# Patient Record
Sex: Female | Born: 1960 | Race: White | Hispanic: No | Marital: Single | Smoking: Never smoker
Health system: Southern US, Community
[De-identification: ages and names within clinical notes are randomized; demographics above are authoritative.]

## PROBLEM LIST (undated history)

## (undated) DIAGNOSIS — F419 Anxiety disorder, unspecified: Secondary | ICD-10-CM

## (undated) DIAGNOSIS — D649 Anemia, unspecified: Secondary | ICD-10-CM

## (undated) DIAGNOSIS — E785 Hyperlipidemia, unspecified: Secondary | ICD-10-CM

## (undated) DIAGNOSIS — R011 Cardiac murmur, unspecified: Secondary | ICD-10-CM

## (undated) DIAGNOSIS — F329 Major depressive disorder, single episode, unspecified: Secondary | ICD-10-CM

## (undated) DIAGNOSIS — F32A Depression, unspecified: Secondary | ICD-10-CM

## (undated) DIAGNOSIS — Z8619 Personal history of other infectious and parasitic diseases: Secondary | ICD-10-CM

## (undated) DIAGNOSIS — J45909 Unspecified asthma, uncomplicated: Secondary | ICD-10-CM

## (undated) DIAGNOSIS — Z5189 Encounter for other specified aftercare: Secondary | ICD-10-CM

## (undated) HISTORY — DX: Cardiac murmur, unspecified: R01.1

## (undated) HISTORY — PX: OTHER SURGICAL HISTORY: SHX169

## (undated) HISTORY — PX: BREAST SURGERY: SHX581

## (undated) HISTORY — DX: Anemia, unspecified: D64.9

## (undated) HISTORY — DX: Personal history of other infectious and parasitic diseases: Z86.19

## (undated) HISTORY — DX: Hyperlipidemia, unspecified: E78.5

## (undated) HISTORY — DX: Major depressive disorder, single episode, unspecified: F32.9

## (undated) HISTORY — DX: Anxiety disorder, unspecified: F41.9

## (undated) HISTORY — PX: TUBAL LIGATION: SHX77

## (undated) HISTORY — DX: Depression, unspecified: F32.A

## (undated) HISTORY — DX: Encounter for other specified aftercare: Z51.89

## (undated) HISTORY — PX: ECTOPIC PREGNANCY SURGERY: SHX613

## (undated) HISTORY — DX: Unspecified asthma, uncomplicated: J45.909

---

## 1999-03-21 ENCOUNTER — Other Ambulatory Visit: Admission: RE | Admit: 1999-03-21 | Discharge: 1999-03-21 | Payer: Self-pay | Admitting: Gynecology

## 2000-03-20 ENCOUNTER — Other Ambulatory Visit: Admission: RE | Admit: 2000-03-20 | Discharge: 2000-03-20 | Payer: Self-pay | Admitting: Gynecology

## 2000-09-25 ENCOUNTER — Other Ambulatory Visit: Admission: RE | Admit: 2000-09-25 | Discharge: 2000-09-25 | Payer: Self-pay | Admitting: Gynecology

## 2000-10-03 ENCOUNTER — Ambulatory Visit (HOSPITAL_COMMUNITY): Admission: RE | Admit: 2000-10-03 | Discharge: 2000-10-03 | Payer: Self-pay | Admitting: Gynecology

## 2000-10-03 ENCOUNTER — Encounter: Payer: Self-pay | Admitting: Gynecology

## 2001-03-18 ENCOUNTER — Other Ambulatory Visit: Admission: RE | Admit: 2001-03-18 | Discharge: 2001-03-18 | Payer: Self-pay | Admitting: Obstetrics and Gynecology

## 2001-07-03 ENCOUNTER — Ambulatory Visit (HOSPITAL_COMMUNITY): Admission: RE | Admit: 2001-07-03 | Discharge: 2001-07-03 | Payer: Self-pay | Admitting: Obstetrics and Gynecology

## 2001-07-03 ENCOUNTER — Encounter: Payer: Self-pay | Admitting: Obstetrics and Gynecology

## 2001-10-10 ENCOUNTER — Inpatient Hospital Stay (HOSPITAL_COMMUNITY): Admission: AD | Admit: 2001-10-10 | Discharge: 2001-10-13 | Payer: Self-pay | Admitting: Obstetrics and Gynecology

## 2001-10-10 ENCOUNTER — Encounter (INDEPENDENT_AMBULATORY_CARE_PROVIDER_SITE_OTHER): Payer: Self-pay

## 2005-05-17 ENCOUNTER — Ambulatory Visit (HOSPITAL_COMMUNITY): Admission: RE | Admit: 2005-05-17 | Discharge: 2005-05-17 | Payer: Self-pay | Admitting: Gynecology

## 2005-09-25 ENCOUNTER — Ambulatory Visit: Payer: Self-pay | Admitting: Internal Medicine

## 2006-04-09 ENCOUNTER — Other Ambulatory Visit: Admission: RE | Admit: 2006-04-09 | Discharge: 2006-04-09 | Payer: Self-pay | Admitting: Gynecology

## 2007-08-18 DIAGNOSIS — J45909 Unspecified asthma, uncomplicated: Secondary | ICD-10-CM | POA: Insufficient documentation

## 2007-08-18 DIAGNOSIS — F329 Major depressive disorder, single episode, unspecified: Secondary | ICD-10-CM

## 2008-12-03 ENCOUNTER — Ambulatory Visit (HOSPITAL_COMMUNITY): Admission: RE | Admit: 2008-12-03 | Discharge: 2008-12-03 | Payer: Self-pay | Admitting: Gynecology

## 2008-12-09 ENCOUNTER — Encounter: Admission: RE | Admit: 2008-12-09 | Discharge: 2008-12-09 | Payer: Self-pay | Admitting: Gynecology

## 2010-12-10 ENCOUNTER — Encounter: Payer: Self-pay | Admitting: Gynecology

## 2011-04-06 NOTE — Discharge Summary (Signed)
[facility]  Patient:    Bethany Sexton, Bethany Sexton Visit Number: [id number] MRN: [id number]          Service Type: OBS Location: 910A 9120 01 Attending Physician:  Miguel Aschoff Dictated by:   Devoria Albe Edward Jolly, M.D. Admit Date:  10/10/2001 Discharge Date: 10/13/2001                             Discharge Summary  ADMISSION DIAGNOSES:          1. Intrauterine gestation at term.                               2. History of prior cesarean section, declines                                  trial of vaginal delivery.                               3. Desire for permanent sterilization.  DISCHARGE DIAGNOSES:          1. Status post repeat low segment transverse                                  cesarean section.                               2. Bilateral tubal ligation.  SIGNIFICANT OPERATIONS AND PROCEDURES:               A repeat low segment transverse cesarean section with bilateral tubal ligation was performed under the direction of Miguel Aschoff, M.D., on October 10, 2001, at the [facility].  HISTORY OF PRESENT ILLNESS:   The patient is a 50 year old, gravida 3, para 0-2-1-2, Caucasian female, status post cesarean section for a twin gestation in 1996, who during her antepartum course elected for a repeat cesarean delivery and permanent sterilization for her current pregnancy.  PHYSICAL EXAMINATION:         The uterus was noted to be gravid with an estimated fetal weight of approximately 7 pounds.  HOSPITAL COURSE:              The patient was admitted on October 10, 2001, at which time she underwent an elective repeat low segment transverse cesarean section with bilateral tubal ligation under the direction of Miguel Aschoff, M.D., at the [facility].  A viable female was delivered with Apgars of 8 at one minute and 9 at five minutes.  The weight was 8 pounds and 2 ounces.  The bilateral tubes and ovaries were noted to be normal at the time of  the surgery, which was uncomplicated.  The patients postoperative course was unremarkable.  The patient was slowly advanced to a regular diet which she was tolerating well at the time of discharge.  The patient had good pain control with Tylox and ibuprofen.  She was able to empty her bladder spontaneously.  The patients incision remained clean, dry, and intact throughout her hospitalization and she was noted to be afebrile.  The patient was ambulating independently at the time of her discharge.  The patients discharge hematocrit  was 25.4% and she was tolerating this well. The final pathology report from the tubal ligation was pending at the time of discharge.  The patient was found to be in good condition and ready for discharge on postoperative day #3.  DISPOSITION:                  The patient is discharged to home.  DIET:                         She will take a regular diet.  DISCHARGE MEDICATIONS:        The patient will take Tylox one to two p.o. q.4-6h. p.r.n.  She will continue with a daily prenatal vitamin.  The patient will take iron sulfate 325 mg p.o. b.i.d.  WOUND CARE:                   The patients staples were removed and Steri-Strips and Benzoin were placed prior to her discharge.  ACTIVITY:                     The patient will have decreased activity over the next six weeks.  SPECIAL INSTRUCTIONS:         She will call if she experiences fever, incisional drainage or redness, increased vaginal bleeding, fever, or any other concern. Dictated by:   Devoria Albe Edward Jolly, M.D. Attending Physician:  Miguel Aschoff DD:  10/13/01 TD:  10/13/01 Job: [id number]

## 2011-04-06 NOTE — Op Note (Signed)
[facility]  Patient:    BRANDA, CHAUDHARY Visit Number: [id number] MRN: [id number]          Service Type: OBS Location: 910A 9120 01 Attending Physician:  Miguel Aschoff Dictated by:   Miguel Aschoff, M.D. Proc. Date: 10/10/01 Admit Date:  10/10/2001                             Operative Report  PREOPERATIVE DIAGNOSIS:       Intrauterine pregnancy at term.  Elective repeat cesarean section, desired sterilization.  POSTOPERATIVE DIAGNOSIS:      Intrauterine pregnancy at term, elective cesarean section, desired sterilization, with delivery of viable female infant, Apgars 8 and 9, weighing 8 pounds 2 ounces.  OPERATION:                    Repeat low flap transverse cesarean section, bilateral [place] tubal sterilization.  SURGEON:                      Miguel Aschoff, M.D.  ANESTHESIA:                   Spinal.  COMPLICATIONS:                None.  INDICATIONS:  The patient is a 50 year old white female, gravida 3, para 1-0-1-2, status post prior cesarean section for twins.  She is now at term and requests an elective repeat cesarean section be carried out.  She declined an attempted VBAC.  In addition that a tubal sterilization was performed.  She understands the risks and limitations of these procedures.  Informed consent has been obtained.  DESCRIPTION OF PROCEDURE:     The patient was taken to the operating room, placed in the sitting position.  Spinal anesthesia was administered without difficulty.  She was then placed in the supine position, deviated to the left and prepped and draped in the usual fashion.  Foley catheter was inserted. Once this was done, Pfannenstiel incision was made, extended down through the subcutaneous tissue with bleeding points being clamped and coagulated as they were encountered.  After this was done, the fascia was identified, incised transversely.  It was then separated from the underlying rectus muscles. Rectus  muscles were divided in the midline.  Peritoneum was then identified and entered carefully, avoiding underlying structures.  The bladder flap was then created and protected with the bladder blade.  An elliptical transverse incision was made at the lower uterine segment.  Amniotic cavity was entered. Clear fluid was obtained and at this point the patient was delivered of a viable female infant, Apgars 8 at one minute and 9 at five minutes, from a vertex LOA position.  The baby was handed to the pediatric team in attendance.  Cord bloods were then obtained for appropriate studies.  Then the placenta was delivered and the uterus was evacuated of any remaining products of conception.  _____ of the uterine incision was then ligated using figure-of-eight sutures of #1 Vicryl.  The first layer was a running interlocking suture of #1 Vicryl followed by an imbricating suture of #1 Vicryl.  After this was done, the bladder flap was reapproximated using continuous 2-0 Vicryl suture.  Tubes and ovaries were then inspected. The tubes appeared to be normal.  They were grasped in the midline portion with Babcock clamps.  A knuckle of tube was thus created and this  knuckle of tube was then ligated with two ligatures of 0 plain gut.  The area between both ligatures was excised and the tubal stumps were cauterized.  At this point lap counts were taken and found to be correct and the abdomen was closed after being irrigated with warm saline.  The parietal peritoneum was closed with using running stainless 0 Vicryl sutures.  The rectus muscles were reapproximated using running continuous suture.  The fascia was closed using two sutures of 0 Vicryl, each starting at the lateral fascial angles and knitting in the midline.  Subcutaneous tissue and skin were closed using staples.  The estimated blood loss was approximately 800 cc.  The patient tolerated the procedure well and went to the recovery room in  satisfactory condition. Dictated by:   Miguel Aschoff, M.D. Attending Physician:  Miguel Aschoff DD:  10/10/01 TD:  10/11/01 Job: [id number]

## 2014-05-29 DIAGNOSIS — F419 Anxiety disorder, unspecified: Secondary | ICD-10-CM | POA: Insufficient documentation

## 2015-05-24 ENCOUNTER — Ambulatory Visit (INDEPENDENT_AMBULATORY_CARE_PROVIDER_SITE_OTHER): Payer: No Typology Code available for payment source | Admitting: Internal Medicine

## 2015-05-24 VITALS — BP 122/82 | HR 74 | Temp 98.2°F | Resp 16 | Ht 63.0 in | Wt 139.6 lb

## 2015-05-24 DIAGNOSIS — F329 Major depressive disorder, single episode, unspecified: Secondary | ICD-10-CM

## 2015-05-24 DIAGNOSIS — F32A Depression, unspecified: Secondary | ICD-10-CM

## 2015-05-24 MED ORDER — SERTRALINE HCL 100 MG PO TABS: 100.0000 mg | ORAL_TABLET | Freq: Every day | ORAL | Status: DC

## 2015-05-24 NOTE — Progress Notes (Signed)
   Subjective:  This chart was scribed for Bethany Siaobert Serria Sloma, MD by Bethany Sexton, Medical Scribe. This patient was seen in Room 1 and the patient's care was started at 2:16 PM.   Patient ID: Bethany Sexton, female    DOB: 01/02/1961, 54 y.o.   MRN: [id number]  HPI HPI Comments: Bethany Sexton is a 54 y.o. female with a PMHx of depression, who presents to [facility] for a sertraline medication refill.  Pt notes that she has been on the medication for 15 years. Pt indicates that this medication increases her appetite.  Pt is not complaining of any medical problems.  Pt has no PCP currently, and is interested in being referred to one of the providers here in the clinic.   Active Ambulatory Problems    Diagnosis Date Noted  . DEPRESSION 08/18/2007  . ASTHMA 08/18/2007   Resolved Ambulatory Problems    Diagnosis Date Noted  . No Resolved Ambulatory Problems   Past Medical History  Diagnosis Date  . Anemia   . Anxiety   . Blood transfusion without reported diagnosis   . Depression    No current outpatient prescriptions on file prior to visit.   No current facility-administered medications on file prior to visit.   Allergies  Allergen Reactions  . Sulfonamide Derivatives     Review of Systems Noncontributory    Objective:   Physical Exam  Constitutional: She is oriented to person, place, and time. She appears well-developed and well-nourished. No distress.  HENT:  Head: Normocephalic and atraumatic.  Eyes: EOM are normal. Pupils are equal, round, and reactive to light.  Neck: Neck supple.  Cardiovascular: Normal rate.   Pulmonary/Chest: Effort normal.  Neurological: She is alert and oriented to person, place, and time. No cranial nerve deficit.  Skin: Skin is warm and dry.  Psychiatric: She has a normal mood and affect. Her behavior is normal.  Nursing note and vitals reviewed.     Assessment & Plan:  I have completed the patient  encounter in its entirety as documented by the scribe, with editing by me where necessary. Bethany Sexton P. Bethany Sexton, M.D.  Depression  Meds ordered this encounter  Medications  . DISCONTD: sertraline (ZOLOFT) 100 MG tablet    Sig: Take 100 mg by mouth daily.  . sertraline (ZOLOFT) 100 MG tablet    Sig: Take 1 tablet (100 mg total) by mouth daily.    Dispense:  90 tablet    Refill:  1   She will call for an appointment 102 with a new provider here

## 2015-12-08 ENCOUNTER — Telehealth: Payer: Self-pay | Admitting: *Deleted

## 2015-12-08 NOTE — Telephone Encounter (Signed)
Pharmacy called and requests a refill on sertraline #90 last filled on 09/09/15

## 2015-12-09 MED ORDER — SERTRALINE HCL 100 MG PO TABS: 100.0000 mg | ORAL_TABLET | Freq: Every day | ORAL | Status: DC

## 2015-12-09 NOTE — Telephone Encounter (Signed)
Last OV 06/13/2015

## 2015-12-09 NOTE — Telephone Encounter (Signed)
Rx sent 

## 2016-01-18 LAB — HM MAMMOGRAPHY: HM Mammogram: NORMAL (ref 0–4)

## 2016-01-18 LAB — HM PAP SMEAR

## 2016-03-01 DIAGNOSIS — D3709 Neoplasm of uncertain behavior of other specified sites of the oral cavity: Secondary | ICD-10-CM | POA: Diagnosis not present

## 2016-03-07 DIAGNOSIS — K136 Irritative hyperplasia of oral mucosa: Secondary | ICD-10-CM | POA: Diagnosis not present

## 2016-04-26 ENCOUNTER — Inpatient Hospital Stay (HOSPITAL_COMMUNITY)
Admission: AD | Admit: 2016-04-26 | Discharge: 2016-04-26 | Disposition: A | Discharge status: Home / Self Care | Payer: BLUE CROSS/BLUE SHIELD | Source: Ambulatory Visit | Attending: Obstetrics and Gynecology | Admitting: Obstetrics and Gynecology

## 2016-04-26 ENCOUNTER — Encounter (HOSPITAL_COMMUNITY): Payer: Self-pay | Admitting: *Deleted

## 2016-04-26 DIAGNOSIS — D62 Acute posthemorrhagic anemia: Secondary | ICD-10-CM | POA: Diagnosis not present

## 2016-04-26 DIAGNOSIS — N939 Abnormal uterine and vaginal bleeding, unspecified: Secondary | ICD-10-CM | POA: Diagnosis not present

## 2016-04-26 DIAGNOSIS — D500 Iron deficiency anemia secondary to blood loss (chronic): Secondary | ICD-10-CM | POA: Diagnosis not present

## 2016-04-26 DIAGNOSIS — D649 Anemia, unspecified: Secondary | ICD-10-CM | POA: Diagnosis not present

## 2016-04-26 LAB — URINE MICROSCOPIC-ADD ON

## 2016-04-26 LAB — CBC
HCT: 27.3 % — ABNORMAL LOW (ref 36.0–46.0)
Hemoglobin: 8.7 g/dL — ABNORMAL LOW (ref 12.0–15.0)
MCH: 24.3 pg — ABNORMAL LOW (ref 26.0–34.0)
MCHC: 31.9 g/dL (ref 30.0–36.0)
MCV: 76.3 fL — ABNORMAL LOW (ref 78.0–100.0)
Platelets: 296 K/uL (ref 150–400)
RBC: 3.58 MIL/uL — ABNORMAL LOW (ref 3.87–5.11)
RDW: 17 % — ABNORMAL HIGH (ref 11.5–15.5)
WBC: 9.4 K/uL (ref 4.0–10.5)

## 2016-04-26 LAB — POCT PREGNANCY, URINE: PREG TEST UR: NEGATIVE

## 2016-04-26 LAB — URINALYSIS, ROUTINE W REFLEX MICROSCOPIC
Glucose, UA: NEGATIVE mg/dL
Ketones, ur: NEGATIVE mg/dL
Nitrite: NEGATIVE
Protein, ur: >300 mg/dL — AB
SPECIFIC GRAVITY, URINE: 1.025 (ref 1.005–1.030)
pH: 5.5 (ref 5.0–8.0)

## 2016-04-26 MED ORDER — FERUMOXYTOL INJECTION 510 MG/17 ML
510.0000 mg | Freq: Once | INTRAVENOUS | Status: AC
  Administered 2016-04-26: 510 mg via INTRAVENOUS
  Filled 2016-04-26: qty 17

## 2016-04-26 MED ORDER — MEGESTROL ACETATE 40 MG PO TABS: 40.0000 mg | ORAL_TABLET | Freq: Two times a day (BID) | ORAL | Status: DC

## 2016-04-26 MED ORDER — SODIUM CHLORIDE 0.9 % IV SOLN
INTRAVENOUS | Status: DC
  Administered 2016-04-26: 17:00:00 via INTRAVENOUS

## 2016-04-26 MED ORDER — MEGESTROL ACETATE 40 MG PO TABS
40.0000 mg | ORAL_TABLET | Freq: Every day | ORAL | Status: DC
  Administered 2016-04-26: 40 mg via ORAL
  Filled 2016-04-26 (×2): qty 1

## 2016-04-26 NOTE — MAU Note (Signed)
Pt sent from office for heavy vaginal bleeding.

## 2016-04-26 NOTE — Discharge Instructions (Signed)
Abnormal Uterine Bleeding °Abnormal uterine bleeding can affect women at various stages in life, including teenagers, women in their reproductive years, pregnant women, and women who have reached menopause. Several kinds of uterine bleeding are considered abnormal, including: °· Bleeding or spotting between periods.   °· Bleeding after sexual intercourse.   °· Bleeding that is heavier or more than normal.   °· Periods that last longer than usual. °· Bleeding after menopause.   °Many cases of abnormal uterine bleeding are minor and simple to treat, while others are more serious. Any type of abnormal bleeding should be evaluated by your health care provider. Treatment will depend on the cause of the bleeding. °HOME CARE INSTRUCTIONS °Monitor your condition for any changes. The following actions may help to alleviate any discomfort you are experiencing: °· Avoid the use of tampons and douches as directed by your health care provider. °· Change your pads frequently. °You should get regular pelvic exams and Pap tests. Keep all follow-up appointments for diagnostic tests as directed by your health care provider.  °SEEK MEDICAL CARE IF:  °· Your bleeding lasts more than 1 week.   °· You feel dizzy at times.   °SEEK IMMEDIATE MEDICAL CARE IF:  °· You pass out.   °· You are changing pads every 15 to 30 minutes.   °· You have abdominal pain. °· You have a fever.   °· You become sweaty or weak.   °· You are passing large blood clots from the vagina.   °· You start to feel nauseous and vomit. °MAKE SURE YOU:  °· Understand these instructions. °· Will watch your condition. °· Will get help right away if you are not doing well or get worse. °  °This information is not intended to replace advice given to you by your health care provider. Make sure you discuss any questions you have with your health care provider. °  °Document Released: 11/05/2005 Document Revised: 11/10/2013 Document Reviewed: 06/04/2013 °Elsevier Interactive  Patient Education ©2016 Else[employer] Inc. °Anemia, Nonspecific °Anemia is a condition in which the concentration of red blood cells or hemoglobin in the blood is below normal. Hemoglobin is a substance in red blood cells that carries oxygen to the tissues of the body. Anemia results in not enough oxygen reaching these tissues.  °CAUSES  °Common causes of anemia include:  °· Excessive bleeding. Bleeding may be internal or external. This includes excessive bleeding from periods (in women) or from the intestine.   °· Poor nutrition.   °· Chronic kidney, thyroid, and liver disease.  °· Bone marrow disorders that decrease red blood cell production. °· Cancer and treatments for cancer. °· HIV, AIDS, and their treatments. °· Spleen problems that increase red blood cell destruction. °· Blood disorders. °· Excess destruction of red blood cells due to infection, medicines, and autoimmune disorders. °SIGNS AND SYMPTOMS  °· Minor weakness.   °· Dizziness.   °· Headache. °· Palpitations.   °· Shortness of breath, especially with exercise.   °· Paleness. °· Cold sensitivity. °· Indigestion. °· Nausea. °· Difficulty sleeping. °· Difficulty concentrating. °Symptoms may occur suddenly or they may develop slowly.  °DIAGNOSIS  °Additional blood tests are often needed. These help your health care provider determine the best treatment. Your health care provider will check your stool for blood and look for other causes of blood loss.  °TREATMENT  °Treatment varies depending on the cause of the anemia. Treatment can include:  °· Supplements of iron, vitamin B12, or folic acid.   °· Hormone medicines.   °· A blood transfusion. This may be needed if blood loss is   severe.   °· Hospitalization. This may be needed if there is significant continual blood loss.   °· Dietary changes. °· Spleen removal. °HOME CARE INSTRUCTIONS °Keep all follow-up appointments. It often takes many weeks to correct anemia, and having your health care provider check on  your condition and your response to treatment is very important. °SEEK IMMEDIATE MEDICAL CARE IF:  °· You develop extreme weakness, shortness of breath, or chest pain.   °· You become dizzy or have trouble concentrating. °· You develop heavy vaginal bleeding.   °· You develop a rash.   °· You have bloody or black, tarry stools.   °· You faint.   °· You vomit up blood.   °· You vomit repeatedly.   °· You have abdominal pain. °· You have a fever or persistent symptoms for more than 2-3 days.   °· You have a fever and your symptoms suddenly get worse.   °· You are dehydrated.   °MAKE SURE YOU: °· Understand these instructions. °· Will watch your condition. °· Will get help right away if you are not doing well or get worse. °  °This information is not intended to replace advice given to you by your health care provider. Make sure you discuss any questions you have with your health care provider. °  °Document Released: 12/13/2004 Document Revised: 07/08/2013 Document Reviewed: 05/01/2013 °Elsevier Interactive Patient Education ©2016 Elsevier Inc. ° °

## 2016-04-26 NOTE — MAU Provider Note (Signed)
History     CSN: [id number]  Arrival date and time: 04/26/16 1548   None     Chief Complaint  Patient presents with  . Anemia  . Vaginal Bleeding   HPI   [name] is a 55 y.o. female [phone] with a history of DUB here with concerns of anemia and vaginal bleeding. She has a long standing history of heavy periods; requiring blood transfusions in the past. She started bleeding sometime at the beginning of May and started taking provera on June 2nd. This was a left over Rx that her GYN gave her in the past. She became concerned today when she started feeling bad and went to her GYN office. She presented there with occasional tachycardia, occasional SOB, and one episode of dizziness early this morning. She went to the office hoping she could get an Rx for iron pills. During the speculum exam in the office, she had a large gush of blood and she was sent over here for CBC and possible blood transfusion.   OB History    Gravida Para Term Preterm AB TAB SAB Ectopic Multiple Living   Past Medical History  Diagnosis Date  . Anemia   . Anxiety   . Blood transfusion without reported diagnosis   . Depression     Past Surgical History  Procedure Laterality Date  . Breast surgery    . Cesarean section    . Tubal ligation      Family History  Problem Relation Age of Onset  . Heart disease Father   . Hyperlipidemia Father   . Hyperlipidemia Sister     Social History  Substance Use Topics  . Smoking status: Never Smoker   . Smokeless tobacco: None  . Alcohol Use: No    Allergies:  Allergies  Allergen Reactions  . Sulfonamide Derivatives     Prescriptions prior to admission  Medication Sig Dispense Refill Last Dose  . sertraline (ZOLOFT) 100 MG tablet Take 1 tablet (100 mg total) by mouth daily. 90 tablet 1    Results for orders placed or performed during the hospital encounter of 04/26/16 (from the past 48 hour(s))  CBC     Status:  Abnormal   Collection Time: 04/26/16  4:48 PM  Result Value Ref Range   WBC 9.4 4.0 - 10.5 K/uL   RBC 3.58 (L) 3.87 - 5.11 MIL/uL   Hemoglobin 8.7 (L) 12.0 - 15.0 g/dL   HCT 14.7 (L) 82.9 - 56.2 %   MCV 76.3 (L) 78.0 - 100.0 fL   MCH 24.3 (L) 26.0 - 34.0 pg   MCHC 31.9 30.0 - 36.0 g/dL   RDW 13.0 (H) 86.5 - 78.4 %   Platelets 296 150 - 400 K/uL  Pregnancy, urine POC     Status: None   Collection Time: 04/26/16  5:27 PM  Result Value Ref Range   Preg Test, Ur NEGATIVE NEGATIVE    Comment:        THE SENSITIVITY OF THIS METHODOLOGY IS >24 mIU/mL    Recent Results (from the past 2160 hour(s))  Urinalysis, Routine w reflex microscopic (not at [facility])     Status: Abnormal   Collection Time: 04/26/16  4:30 PM  Result Value Ref Range   Color, Urine YELLOW YELLOW   APPearance CLEAR CLEAR   Specific Gravity, Urine 1.025 1.005 - 1.030   pH 5.5 5.0 - 8.0  Glucose, UA NEGATIVE NEGATIVE mg/dL   Hgb urine dipstick LARGE (A) NEGATIVE   Bilirubin Urine SMALL (A) NEGATIVE   Ketones, ur NEGATIVE NEGATIVE mg/dL   Protein, ur >161>300 (A) NEGATIVE mg/dL   Nitrite NEGATIVE NEGATIVE   Leukocytes, UA TRACE (A) NEGATIVE  Urine microscopic-add on     Status: Abnormal   Collection Time: 04/26/16  4:30 PM  Result Value Ref Range   Squamous Epithelial / LPF 0-5 (A) NONE SEEN   WBC, UA 0-5 0 - 5 WBC/hpf   RBC / HPF TOO NUMEROUS TO COUNT 0 - 5 RBC/hpf   Bacteria, UA MANY (A) NONE SEEN   Urine-Other URINALYSIS PERFORMED ON SUPERNATANT   CBC     Status: Abnormal   Collection Time: 04/26/16  4:48 PM  Result Value Ref Range   WBC 9.4 4.0 - 10.5 K/uL   RBC 3.58 (L) 3.87 - 5.11 MIL/uL   Hemoglobin 8.7 (L) 12.0 - 15.0 g/dL   HCT 09.627.3 (L) 04.536.0 - 40.946.0 %   MCV 76.3 (L) 78.0 - 100.0 fL   MCH 24.3 (L) 26.0 - 34.0 pg   MCHC 31.9 30.0 - 36.0 g/dL   RDW 81.117.0 (H) 91.411.5 - 78.215.5 %   Platelets 296 150 - 400 K/uL  Pregnancy, urine POC     Status: None   Collection Time: 04/26/16  5:27 PM  Result Value Ref Range    Preg Test, Ur NEGATIVE NEGATIVE    Comment:        THE SENSITIVITY OF THIS METHODOLOGY IS >24 mIU/mL     Review of Systems  Constitutional: Negative for malaise/fatigue.  Genitourinary: Negative for dysuria, urgency and frequency.  Neurological: Positive for dizziness (1 X today. ) and weakness.   Physical Exam   Blood pressure 137/72, pulse 87, temperature 98.2 F (36.8 C), temperature source Oral, resp. rate 17, height 5\' 2"  (1.575 m), weight 140 lb 6.4 oz (63.685 kg), last menstrual period 03/19/2016.  Physical Exam  Constitutional: She is oriented to person, place, and time. She appears well-developed and well-nourished. No distress.  Neck: Normal range of motion.  Cardiovascular: Normal rate and normal heart sounds.   Respiratory: Effort normal.  Musculoskeletal: Normal range of motion.  Neurological: She is alert and oriented to person, place, and time.  Skin: Skin is warm. She is not diaphoretic.  Psychiatric: Her behavior is normal.    MAU Course  Procedures  None  MDM  Megace 40 mg given in MAU PO Feraheme 510 IV NS bolus  Ortho static vitals normal following iron transfusion Discussed patient with Dr. Renaldo FiddlerAdkins   Assessment and Plan   A:  1. Episode of heavy vaginal bleeding   2. Acute blood loss anemia     P:  Discharge home in stable condition Continue PO iron at home.  The patient requests injectafer iron for her next infusion, discussed this with Dr. Renaldo FiddlerAdkins who will have this set up from the office. Patient instructed to call the office next week to schedule pelvic US Bleeding precautions Return to MAU with any dizziness, increased bleeding Rx: Megace.    Duane LopeJennifer I Pricsilla Lindvall, NP 04/28/2016 11:37 AM

## 2016-05-01 DIAGNOSIS — N924 Excessive bleeding in the premenopausal period: Secondary | ICD-10-CM | POA: Diagnosis not present

## 2016-05-01 DIAGNOSIS — D649 Anemia, unspecified: Secondary | ICD-10-CM | POA: Diagnosis not present

## 2016-05-01 DIAGNOSIS — N939 Abnormal uterine and vaginal bleeding, unspecified: Secondary | ICD-10-CM | POA: Diagnosis not present

## 2016-05-01 DIAGNOSIS — N920 Excessive and frequent menstruation with regular cycle: Secondary | ICD-10-CM | POA: Diagnosis not present

## 2016-05-24 ENCOUNTER — Other Ambulatory Visit (HOSPITAL_COMMUNITY): Payer: Self-pay | Admitting: *Deleted

## 2016-05-25 ENCOUNTER — Ambulatory Visit (HOSPITAL_COMMUNITY)
Admission: RE | Admit: 2016-05-25 | Discharge: 2016-05-25 | Disposition: A | Discharge status: Home / Self Care | Payer: BLUE CROSS/BLUE SHIELD | Source: Ambulatory Visit | Attending: Obstetrics and Gynecology | Admitting: Obstetrics and Gynecology

## 2016-05-25 DIAGNOSIS — D649 Anemia, unspecified: Secondary | ICD-10-CM | POA: Diagnosis not present

## 2016-05-25 MED ORDER — SODIUM CHLORIDE 0.9 % IV SOLN
510.0000 mg | Freq: Once | INTRAVENOUS | Status: AC
  Administered 2016-05-25: 510 mg via INTRAVENOUS
  Filled 2016-05-25: qty 17

## 2016-06-06 ENCOUNTER — Telehealth: Payer: Self-pay

## 2016-06-06 NOTE — Telephone Encounter (Signed)
Sertraline Hcl 100mg  #90 RF x1 req via fax. Last OV 05/2015

## 2016-06-07 MED ORDER — SERTRALINE HCL 100 MG PO TABS: 100.0000 mg | ORAL_TABLET | Freq: Every day | ORAL | Status: DC

## 2016-06-07 NOTE — Telephone Encounter (Signed)
Advised pt and she plans to make appointment.

## 2016-06-07 NOTE — Telephone Encounter (Signed)
Meds ordered this encounter  Medications  . sertraline (ZOLOFT) 100 MG tablet    Sig: Take 1 tablet (100 mg total) by mouth daily.    Dispense:  90 tablet    Refill:  0    Need office visit for additional refills.    Order Specific Question:  Supervising Provider    Answer:  Clelia CroftSHAW, EVA N [[id number]]    Please advise patient that it has been a year since her last visit here and she needs an OV for additional fills.

## 2016-09-03 ENCOUNTER — Telehealth: Payer: Self-pay | Admitting: Emergency Medicine

## 2016-09-03 MED ORDER — SERTRALINE HCL 100 MG PO TABS: 100.0000 mg | ORAL_TABLET | Freq: Every day | ORAL | 0 refills | Status: DC

## 2016-09-03 NOTE — Telephone Encounter (Signed)
Patient calling requesting 12 pills of the Sertraline until her NPT appointment on 09/17/16. She states the urgent care that prescribed the Sertraline last requesting an appointment.

## 2016-09-03 NOTE — Telephone Encounter (Signed)
Medication filled to pharmacy as requested.   

## 2016-09-03 NOTE — Telephone Encounter (Signed)
Ok for #30, no refills 

## 2016-09-07 ENCOUNTER — Encounter: Payer: Self-pay | Admitting: Emergency Medicine

## 2016-09-12 ENCOUNTER — Other Ambulatory Visit: Payer: Self-pay | Admitting: Physician Assistant

## 2016-09-17 ENCOUNTER — Ambulatory Visit (INDEPENDENT_AMBULATORY_CARE_PROVIDER_SITE_OTHER): Payer: BLUE CROSS/BLUE SHIELD | Admitting: Family Medicine

## 2016-09-17 ENCOUNTER — Encounter: Payer: Self-pay | Admitting: Family Medicine

## 2016-09-17 VITALS — BP 118/82 | HR 75 | Temp 98.0°F | Resp 16 | Ht 62.0 in | Wt 141.0 lb

## 2016-09-17 DIAGNOSIS — Z0000 Encounter for general adult medical examination without abnormal findings: Secondary | ICD-10-CM

## 2016-09-17 DIAGNOSIS — Z23 Encounter for immunization: Secondary | ICD-10-CM

## 2016-09-17 LAB — CBC WITH DIFFERENTIAL/PLATELET
BASOS ABS: 0 10*3/uL (ref 0.0–0.1)
Basophils Relative: 0.5 % (ref 0.0–3.0)
Eosinophils Absolute: 0.1 10*3/uL (ref 0.0–0.7)
Eosinophils Relative: 1.5 % (ref 0.0–5.0)
HCT: 42.7 % (ref 36.0–46.0)
Hemoglobin: 14.6 g/dL (ref 12.0–15.0)
LYMPHS ABS: 1.7 10*3/uL (ref 0.7–4.0)
Lymphocytes Relative: 23.5 % (ref 12.0–46.0)
MCHC: 34.3 g/dL (ref 30.0–36.0)
MCV: 88.8 fl (ref 78.0–100.0)
MONO ABS: 0.4 10*3/uL (ref 0.1–1.0)
MONOS PCT: 4.9 % (ref 3.0–12.0)
NEUTROS ABS: 5.1 10*3/uL (ref 1.4–7.7)
NEUTROS PCT: 69.6 % (ref 43.0–77.0)
PLATELETS: 274 10*3/uL (ref 150.0–400.0)
RBC: 4.81 Mil/uL (ref 3.87–5.11)
RDW: 14.9 % (ref 11.5–15.5)
WBC: 7.4 10*3/uL (ref 4.0–10.5)

## 2016-09-17 LAB — LIPID PANEL
CHOLESTEROL: 292 mg/dL — AB (ref 0–200)
HDL: 56.2 mg/dL (ref >39.00)
NonHDL: 235.43
Total CHOL/HDL Ratio: 5
Triglycerides: 201 mg/dL — ABNORMAL HIGH (ref 0.0–149.0)
VLDL: 40.2 mg/dL — ABNORMAL HIGH (ref 0.0–40.0)

## 2016-09-17 LAB — BASIC METABOLIC PANEL
BUN: 16 mg/dL (ref 6–23)
CHLORIDE: 103 meq/L (ref 96–112)
CO2: 28 meq/L (ref 19–32)
Calcium: 10.2 mg/dL (ref 8.4–10.5)
Creatinine, Ser: 0.91 mg/dL (ref 0.40–1.20)
GFR: 68.59 mL/min (ref >60.00)
GLUCOSE: 78 mg/dL (ref 70–99)
POTASSIUM: 4 meq/L (ref 3.5–5.1)
SODIUM: 141 meq/L (ref 135–145)

## 2016-09-17 LAB — HEPATIC FUNCTION PANEL
ALBUMIN: 4.6 g/dL (ref 3.5–5.2)
ALK PHOS: 54 U/L (ref 39–117)
ALT: 21 U/L (ref 0–35)
AST: 21 U/L (ref 0–37)
BILIRUBIN DIRECT: 0.2 mg/dL (ref 0.0–0.3)
TOTAL PROTEIN: 7.4 g/dL (ref 6.0–8.3)
Total Bilirubin: 1.3 mg/dL — ABNORMAL HIGH (ref 0.2–1.2)

## 2016-09-17 LAB — VITAMIN D 25 HYDROXY (VIT D DEFICIENCY, FRACTURES): VITD: 21.24 ng/mL — AB (ref 30.00–100.00)

## 2016-09-17 LAB — TSH: TSH: 0.75 u[IU]/mL (ref 0.35–4.50)

## 2016-09-17 LAB — LDL CHOLESTEROL, DIRECT: Direct LDL: 210 mg/dL

## 2016-09-17 NOTE — Progress Notes (Signed)
Pre visit review using our clinic review tool, if applicable. No additional management support is needed unless otherwise documented below in the visit note. 

## 2016-09-17 NOTE — Addendum Note (Signed)
Addended by: Geannie RisenBRODMERKEL, Orlanda Frankum L on: 09/17/2016 03:21 PM   Modules accepted: Orders

## 2016-09-17 NOTE — Progress Notes (Signed)
   Subjective:    Patient ID: Bethany Sexton Headings, female    DOB: 08/27/1961, 55 y.o.   MRN: [id number]  HPI New to establish.  Previous MD- [facility]  GYN- [facility]  CPE- UTD on pap, due for mammo (last done Jan 2015).  Due for colonoscopy.  Pt refuses colonoscopy.  Open to cologuard.  Due for flu and Tdap.   Review of Systems Patient reports no vision/ hearing changes, adenopathy,fever, weight change,  persistant/recurrent hoarseness , swallowing issues, chest pain, palpitations, edema, persistant/recurrent cough, hemoptysis, dyspnea (rest/exertional/paroxysmal nocturnal), gastrointestinal bleeding (melena, rectal bleeding), abdominal pain, significant heartburn, bowel changes, GU symptoms (dysuria, hematuria, incontinence), Gyn symptoms (abnormal  bleeding, pain),  syncope, focal weakness, memory loss, numbness & tingling, skin/hair/nail changes, abnormal bruising or bleeding, anxiety, or depression.     Objective:   Physical Exam General Appearance:    Alert, cooperative, no distress, appears stated age  Head:    Normocephalic, without obvious abnormality, atraumatic  Eyes:    PERRL, conjunctiva/corneas clear, EOM's intact, fundi    benign, both eyes  Ears:    Normal TM's and external ear canals, both ears  Nose:   Nares normal, septum midline, mucosa normal, no drainage    or sinus tenderness  Throat:   Lips, mucosa, and tongue normal; teeth and gums normal  Neck:   Supple, symmetrical, trachea midline, no adenopathy;    Thyroid: no enlargement/tenderness/nodules  Back:     Symmetric, no curvature, ROM normal, no CVA tenderness  Lungs:     Clear to auscultation bilaterally, respirations unlabored  Chest Wall:    No tenderness or deformity   Heart:    Regular rate and rhythm, S1 and S2 normal, no murmur, rub   or gallop  Breast Exam:    Deferred to GYN  Abdomen:     Soft, non-tender, bowel sounds active all four quadrants,    no masses, no organomegaly  Genitalia:    Deferred to  GYN  Rectal:    Extremities:   Extremities normal, atraumatic, no cyanosis or edema  Pulses:   2+ and symmetric all extremities  Skin:   Skin color, texture, turgor normal, no rashes or lesions  Lymph nodes:   Cervical, supraclavicular, and axillary nodes normal  Neurologic:   CNII-XII intact, normal strength, sensation and reflexes    throughout          Assessment & Plan:  PE- pt's PE WNL.  UTD on pap.  Due for mammo- pt to call and schedule appt.  Pt declines colonoscopy, open to cologuard.  Will get Tdap and flu today.  Check labs.  Anticipatory guidance provided.

## 2016-09-17 NOTE — Patient Instructions (Signed)
Follow up in 1 year or as needed We'll notify you of your lab results and make any changes if needed Keep up the good work on healthy diet and regular exercise- you look great!!! Call Dr Renaldo FiddlerAdkins and schedule your mammogram Complete the cologuard and return as directed Call with any questions or concerns Welcome!  We're glad to have you!!!

## 2016-09-19 ENCOUNTER — Other Ambulatory Visit: Payer: Self-pay | Admitting: General Practice

## 2016-09-19 DIAGNOSIS — E785 Hyperlipidemia, unspecified: Secondary | ICD-10-CM

## 2016-09-19 MED ORDER — VITAMIN D (ERGOCALCIFEROL) 1.25 MG (50000 UNIT) PO CAPS: 50000.0000 [IU] | ORAL_CAPSULE | ORAL | 0 refills | Status: DC

## 2016-09-19 MED ORDER — ROSUVASTATIN CALCIUM 20 MG PO TABS: 20.0000 mg | ORAL_TABLET | Freq: Every evening | ORAL | 6 refills | Status: DC

## 2016-10-08 ENCOUNTER — Other Ambulatory Visit: Payer: Self-pay | Admitting: Family Medicine

## 2016-10-08 MED ORDER — SERTRALINE HCL 100 MG PO TABS: 100.0000 mg | ORAL_TABLET | Freq: Every day | ORAL | 3 refills | Status: DC

## 2016-10-08 NOTE — Telephone Encounter (Signed)
Medication filled to pharmacy as requested.   

## 2016-11-24 ENCOUNTER — Encounter: Payer: Self-pay | Admitting: Family Medicine

## 2016-11-26 MED ORDER — ROSUVASTATIN CALCIUM 20 MG PO TABS: 20.0000 mg | ORAL_TABLET | Freq: Every evening | ORAL | 1 refills | Status: DC

## 2017-01-03 DIAGNOSIS — C44629 Squamous cell carcinoma of skin of left upper limb, including shoulder: Secondary | ICD-10-CM | POA: Diagnosis not present

## 2017-01-03 DIAGNOSIS — D485 Neoplasm of uncertain behavior of skin: Secondary | ICD-10-CM | POA: Diagnosis not present

## 2017-01-03 DIAGNOSIS — L814 Other melanin hyperpigmentation: Secondary | ICD-10-CM | POA: Diagnosis not present

## 2017-01-20 DIAGNOSIS — J029 Acute pharyngitis, unspecified: Secondary | ICD-10-CM | POA: Diagnosis not present

## 2017-02-04 ENCOUNTER — Other Ambulatory Visit: Payer: Self-pay | Admitting: Family Medicine

## 2017-02-11 ENCOUNTER — Telehealth: Payer: Self-pay | Admitting: Family Medicine

## 2017-02-11 NOTE — Telephone Encounter (Signed)
Pt states that she forgot to f/u for her 1 month lab back in Nov. And asking if she should still have that done or is a f/u appt needed. Please advise.

## 2017-02-11 NOTE — Telephone Encounter (Signed)
FYI, spoke with pt. She advised that she is taking the cholesterol medication instead of scheduling her a lab appt, I scheduled pt for next month for a repeat cholesterol appt.

## 2017-02-25 DIAGNOSIS — C44629 Squamous cell carcinoma of skin of left upper limb, including shoulder: Secondary | ICD-10-CM | POA: Diagnosis not present

## 2017-02-25 DIAGNOSIS — L905 Scar conditions and fibrosis of skin: Secondary | ICD-10-CM | POA: Diagnosis not present

## 2017-03-14 ENCOUNTER — Ambulatory Visit (INDEPENDENT_AMBULATORY_CARE_PROVIDER_SITE_OTHER): Payer: BLUE CROSS/BLUE SHIELD | Admitting: Family Medicine

## 2017-03-14 ENCOUNTER — Encounter: Payer: Self-pay | Admitting: Family Medicine

## 2017-03-14 DIAGNOSIS — E785 Hyperlipidemia, unspecified: Secondary | ICD-10-CM

## 2017-03-14 LAB — HEPATIC FUNCTION PANEL
ALK PHOS: 61 U/L (ref 39–117)
ALT: 21 U/L (ref 0–35)
AST: 24 U/L (ref 0–37)
Albumin: 4.7 g/dL (ref 3.5–5.2)
BILIRUBIN DIRECT: 0.1 mg/dL (ref 0.0–0.3)
BILIRUBIN TOTAL: 0.9 mg/dL (ref 0.2–1.2)
Total Protein: 7.2 g/dL (ref 6.0–8.3)

## 2017-03-14 LAB — BASIC METABOLIC PANEL
BUN: 21 mg/dL (ref 6–23)
CALCIUM: 10.1 mg/dL (ref 8.4–10.5)
CO2: 28 mEq/L (ref 19–32)
CREATININE: 0.85 mg/dL (ref 0.40–1.20)
Chloride: 106 mEq/L (ref 96–112)
GFR: 74.07 mL/min (ref >60.00)
GLUCOSE: 85 mg/dL (ref 70–99)
POTASSIUM: 4.5 meq/L (ref 3.5–5.1)
Sodium: 140 mEq/L (ref 135–145)

## 2017-03-14 LAB — LIPID PANEL
CHOLESTEROL: 173 mg/dL (ref 0–200)
HDL: 71.5 mg/dL (ref >39.00)
LDL Cholesterol: 79 mg/dL (ref 0–99)
NONHDL: 101.29
Total CHOL/HDL Ratio: 2
Triglycerides: 111 mg/dL (ref 0.0–149.0)
VLDL: 22.2 mg/dL (ref 0.0–40.0)

## 2017-03-14 NOTE — Progress Notes (Signed)
   Subjective:    Patient ID: Bethany Sexton, female    DOB: 1961-05-26, 56 y.o.   MRN: [id number]  HPI Hyperlipidemia- noted at last visit when LDL was 210!  Started on Crestor  at that time.  + strong family hx.  Pt has not noticed any adverse effects since starting med.  No CP, SOB, HAs, visual changes, abd pain, N/V.  Limited exercise.  Pt has been doing Keto diet this week.   Review of Systems For ROS see HPI     Objective:   Physical Exam  Constitutional: She is oriented to person, place, and time. She appears well-developed and well-nourished. No distress.  HENT:  Head: Normocephalic and atraumatic.  Eyes: Conjunctivae and EOM are normal. Pupils are equal, round, and reactive to light.  Neck: Normal range of motion. Neck supple. No thyromegaly present.  Cardiovascular: Normal rate, regular rhythm, normal heart sounds and intact distal pulses.   No murmur heard. Pulmonary/Chest: Effort normal and breath sounds normal. No respiratory distress.  Abdominal: Soft. She exhibits no distension. There is no tenderness.  Musculoskeletal: She exhibits no edema.  Lymphadenopathy:    She has no cervical adenopathy.  Neurological: She is alert and oriented to person, place, and time.  Skin: Skin is warm and dry.  Psychiatric: She has a normal mood and affect. Her behavior is normal.  Vitals reviewed.         Assessment & Plan:

## 2017-03-14 NOTE — Progress Notes (Signed)
Pre visit review using our clinic review tool, if applicable. No additional management support is needed unless otherwise documented below in the visit note. 

## 2017-03-14 NOTE — Patient Instructions (Addendum)
Schedule your complete physical in 6 months We'll notify you of your lab results and make any changes if needed Continue to work on healthy diet and regular exercise- you look great! Call with any questions or concerns Happy Belated Birthday!!!00

## 2017-03-14 NOTE — Assessment & Plan Note (Signed)
New at last visit.  Started on Crestor  daily.  Pt has strong family hx of hyperlipidemia.  Tolerating statin w/o difficulty.  Stressed need for healthy diet and regular exercise.  Check labs.  Adjust meds prn

## 2017-05-06 DIAGNOSIS — J209 Acute bronchitis, unspecified: Secondary | ICD-10-CM | POA: Diagnosis not present

## 2017-05-06 DIAGNOSIS — J018 Other acute sinusitis: Secondary | ICD-10-CM | POA: Diagnosis not present

## 2017-05-07 ENCOUNTER — Ambulatory Visit (INDEPENDENT_AMBULATORY_CARE_PROVIDER_SITE_OTHER): Payer: BLUE CROSS/BLUE SHIELD | Admitting: Physician Assistant

## 2017-05-07 ENCOUNTER — Ambulatory Visit (INDEPENDENT_AMBULATORY_CARE_PROVIDER_SITE_OTHER): Payer: BLUE CROSS/BLUE SHIELD

## 2017-05-07 ENCOUNTER — Encounter: Payer: Self-pay | Admitting: Physician Assistant

## 2017-05-07 VITALS — BP 118/70 | HR 81 | Temp 97.5°F | Resp 14 | Ht 62.0 in | Wt 141.0 lb

## 2017-05-07 DIAGNOSIS — J209 Acute bronchitis, unspecified: Secondary | ICD-10-CM | POA: Diagnosis not present

## 2017-05-07 DIAGNOSIS — R062 Wheezing: Secondary | ICD-10-CM | POA: Diagnosis not present

## 2017-05-07 DIAGNOSIS — J45909 Unspecified asthma, uncomplicated: Secondary | ICD-10-CM

## 2017-05-07 DIAGNOSIS — R0989 Other specified symptoms and signs involving the circulatory and respiratory systems: Secondary | ICD-10-CM | POA: Diagnosis not present

## 2017-05-07 MED ORDER — BECLOMETHASONE DIPROP HFA 40 MCG/ACT IN AERB: 2.0000 | INHALATION_SPRAY | Freq: Two times a day (BID) | RESPIRATORY_TRACT | 1 refills | Status: DC

## 2017-05-07 MED ORDER — BECLOMETHASONE DIPROPIONATE 40 MCG/ACT IN AERS
2.0000 | INHALATION_SPRAY | Freq: Two times a day (BID) | RESPIRATORY_TRACT | 1 refills | Status: DC
Start: 2017-05-07 — End: 2017-05-07

## 2017-05-07 MED ORDER — IPRATROPIUM-ALBUTEROL 0.5-2.5 (3) MG/3ML IN SOLN
3.0000 mL | Freq: Once | RESPIRATORY_TRACT | Status: AC
  Administered 2017-05-07: 3 mL via RESPIRATORY_TRACT

## 2017-05-07 MED ORDER — IPRATROPIUM-ALBUTEROL 0.5-2.5 (3) MG/3ML IN SOLN: 3.0000 mL | Freq: Four times a day (QID) | RESPIRATORY_TRACT | Status: DC

## 2017-05-07 NOTE — Progress Notes (Signed)
Pre visit review using our clinic review tool, if applicable. No additional management support is needed unless otherwise documented below in the visit note. 

## 2017-05-07 NOTE — Patient Instructions (Signed)
Continue the antibiotic given yesterday by [facility] as directed. We may alter this based on imaging results. Please speak with Marylene Landngela at the front desk regarding directions for x-ray.   Increase fluids.  Get plenty of rest. Use Mucinex for congestion. Continue Tessalon. Start the Qvar as directed.  . Take a daily probiotic (I recommend Align or Culturelle, but even Activia Yogurt may be beneficial).  A humidifier placed in the bedroom may offer some relief for a dry, scratchy throat of nasal irritation.  Read information below on acute bronchitis. Please call or return to clinic if symptoms are not improving.  Acute Bronchitis Bronchitis is when the airways that extend from the windpipe into the lungs get red, puffy, and painful (inflamed). Bronchitis often causes thick spit (mucus) to develop. This leads to a cough. A cough is the most common symptom of bronchitis. In acute bronchitis, the condition usually begins suddenly and goes away over time (usually in 2 weeks). Smoking, allergies, and asthma can make bronchitis worse. Repeated episodes of bronchitis may cause more lung problems.  HOME CARE  Rest.  Drink enough fluids to keep your pee (urine) clear or pale yellow (unless you need to limit fluids as told by your doctor).  Only take over-the-counter or prescription medicines as told by your doctor.  Avoid smoking and secondhand smoke. These can make bronchitis worse. If you are a smoker, think about using nicotine gum or skin patches. Quitting smoking will help your lungs heal faster.  Reduce the chance of getting bronchitis again by:  Washing your hands often.  Avoiding people with cold symptoms.  Trying not to touch your hands to your mouth, nose, or eyes.  Follow up with your doctor as told.  GET HELP IF: Your symptoms do not improve after 1 week of treatment. Symptoms include:  Cough.  Fever.  Coughing up thick spit.  Body aches.  Chest  congestion.  Chills.  Shortness of breath.  Sore throat.  GET HELP RIGHT AWAY IF:   You have an increased fever.  You have chills.  You have severe shortness of breath.  You have bloody thick spit (sputum).  You throw up (vomit) often.  You lose too much body fluid (dehydration).  You have a severe headache.  You faint.  MAKE SURE YOU:   Understand these instructions.  Will watch your condition.  Will get help right away if you are not doing well or get worse. Document Released: 04/23/2008 Document Revised: 07/08/2013 Document Reviewed: 04/28/2013 [facility] Patient Information 2015 [place], [place]. This information is not intended to replace advice given to you by your health care provider. Make sure you discuss any questions you have with your health care provider.

## 2017-05-07 NOTE — Progress Notes (Signed)
Patient presents to clinic today c/o 2 weeks of chest congestion, sinus congestion, cough that is sometimes productive thick yellow phlegm. Endorses headache and low-grade fevers. Denies sinus pain, ear pain or tooth pain. Patient denies recent travel or sick contact. Patient presented to a local [facility] yesterday and was diagnosed with bronchitis and given Rx Augmentin, tessalon perles, Ventolin. Is taking medication as directed. Notes feeling better since starting medications. No fever in 24 hours. Denies new symptoms.   Past Medical History:  Diagnosis Date  . Anemia   . Anxiety   . Asthma   . Blood transfusion without reported diagnosis   . Depression   . Heart murmur   . History of chicken pox   . Hyperlipidemia     Current Outpatient Prescriptions on File Prior to Visit  Medication Sig Dispense Refill  . rosuvastatin (CRESTOR) 20 MG tablet Take 1 tablet (20 mg total) by mouth every evening. 90 tablet 1  . sertraline (ZOLOFT) 100 MG tablet take 1 tablet by mouth once daily 30 tablet 3   No current facility-administered medications on file prior to visit.     Allergies  Allergen Reactions  . Sulfonamide Derivatives Rash    Family History  Problem Relation Age of Onset  . Hypertension Mother   . Heart disease Father   . Hyperlipidemia Father   . Hyperlipidemia Sister   . Alzheimer's disease Maternal Grandmother   . Healthy Maternal Grandfather   . Heart disease Paternal Grandmother   . Healthy Paternal Grandfather     Social History   Social History  . Marital status: Single    Spouse name: N/A  . Number of children: N/A  . Years of education: N/A   Social History Main Topics  . Smoking status: Never Smoker  . Smokeless tobacco: Never Used  . Alcohol use 0.0 oz/week  . Drug use: No  . Sexual activity: Not Asked   Other Topics Concern  . None   Social History Narrative  . None   Review of Systems - See HPI.  All other ROS are negative.  BP  118/70   Pulse 81   Temp 97.5 F (36.4 C) (Oral)   Resp 14   Ht 5\' 2"  (1.575 m)   Wt 141 lb (64 kg)   SpO2 96%   BMI 25.79 kg/m   Physical Exam  Constitutional: She is oriented to person, place, and time and well-developed, well-nourished, and in no distress.  HENT:  Head: Normocephalic and atraumatic.  Right Ear: External ear normal.  Left Ear: External ear normal.  Nose: Nose normal.  Mouth/Throat: Oropharynx is clear and moist. No oropharyngeal exudate.  TM within normal limits bilaterally.  Cardiovascular: Normal rate, regular rhythm, normal heart sounds and intact distal pulses.   Pulmonary/Chest: Effort normal. No respiratory distress. She has wheezes. She has no rales. She exhibits no tenderness.  Neurological: She is alert and oriented to person, place, and time.  Skin: Skin is warm and dry. No rash noted.  Psychiatric: Affect normal.  Vitals reviewed.   Recent Results (from the past 2160 hour(s))  Basic metabolic panel     Status: None   Collection Time: 03/14/17  9:57 AM  Result Value Ref Range   Sodium 140 135 - 145 mEq/L   Potassium 4.5 3.5 - 5.1 mEq/L   Chloride 106 96 - 112 mEq/L   CO2 28 19 - 32 mEq/L   Glucose, Bld 85 70 - 99 mg/dL  BUN 21 6 - 23 mg/dL   Creatinine, Ser 2.95 0.40 - 1.20 mg/dL   Calcium 62.1 8.4 - 30.8 mg/dL   GFR 65.78 >46.96 mL/min  Lipid panel     Status: None   Collection Time: 03/14/17  9:57 AM  Result Value Ref Range   Cholesterol 173 0 - 200 mg/dL    Comment: ATP III Classification       Desirable:  < 200 mg/dL               Borderline High:  200 - 239 mg/dL          High:  > = 295 mg/dL   Triglycerides 284.1 0.0 - 149.0 mg/dL    Comment: Normal:  <324 mg/dLBorderline High:  150 - 199 mg/dL   HDL 40.10 >27.25 mg/dL   VLDL 36.6 0.0 - 44.0 mg/dL   LDL Cholesterol 79 0 - 99 mg/dL   Total CHOL/HDL Ratio 2     Comment:                Men          Women1/2 Average Risk     3.4          3.3Average Risk          5.0          4.42X  Average Risk          9.6          7.13X Average Risk          15.0          11.0                       NonHDL 101.29     Comment: NOTE:  Non-HDL goal should be 30 mg/dL higher than patient's LDL goal (i.e. LDL goal of < 70 mg/dL, would have non-HDL goal of < 100 mg/dL)  Hepatic function panel     Status: None   Collection Time: 03/14/17  9:57 AM  Result Value Ref Range   Total Bilirubin 0.9 0.2 - 1.2 mg/dL   Bilirubin, Direct 0.1 0.0 - 0.3 mg/dL   Alkaline Phosphatase 61 39 - 117 U/L   AST 24 0 - 37 U/L   ALT 21 0 - 35 U/L   Total Protein 7.2 6.0 - 8.3 g/dL   Albumin 4.7 3.5 - 5.2 g/dL   Assessment/Plan: 1. Acute bronchitis with asthma Duoneb given with significant improvement in wheezing. Discussed likely need for oral steroid. Patient declines. Will start Qvar. Continue antibiotics and tessalon as directed. Return precautions discussed.   - ipratropium-albuterol (DUONEB) 0.5-2.5 (3) MG/3ML nebulizer solution 3 mL; Take 3 mLs by nebulization once. - DG Chest 2 View; Future - beclomethasone (QVAR REDIHALER) 40 MCG/ACT inhaler; Inhale 2 puffs into the lungs 2 (two) times daily.  Dispense: 10.6 g; Refill: 1   Piedad Climes, [place]

## 2017-05-09 ENCOUNTER — Telehealth: Payer: Self-pay | Admitting: *Deleted

## 2017-05-09 NOTE — Telephone Encounter (Signed)
Pharmacy initiated request for PA.    PA Started through Cover [employer].   KEY: [id number]  Awaiting approval.

## 2017-05-09 NOTE — Telephone Encounter (Signed)
Received approval for medication.   Medication approved 05/09/17 - 05/09/2018   Pharmacy notified.

## 2017-06-02 ENCOUNTER — Other Ambulatory Visit: Payer: Self-pay | Admitting: Family Medicine

## 2017-06-10 ENCOUNTER — Other Ambulatory Visit: Payer: Self-pay | Admitting: General Practice

## 2017-06-10 MED ORDER — SERTRALINE HCL 100 MG PO TABS: 100.0000 mg | ORAL_TABLET | Freq: Every day | ORAL | 3 refills | Status: DC

## 2017-07-03 ENCOUNTER — Other Ambulatory Visit: Payer: Self-pay | Admitting: Physician Assistant

## 2017-07-03 DIAGNOSIS — J45909 Unspecified asthma, uncomplicated: Principal | ICD-10-CM

## 2017-07-03 DIAGNOSIS — J209 Acute bronchitis, unspecified: Secondary | ICD-10-CM

## 2017-07-03 NOTE — Telephone Encounter (Signed)
Was during flare. If needed chronically, needs to have assessment with PCP.

## 2017-07-03 NOTE — Telephone Encounter (Signed)
Should this be continued medication or just acute for the bronchitis?  Please advise

## 2017-09-04 ENCOUNTER — Other Ambulatory Visit: Payer: Self-pay | Admitting: Family Medicine

## 2017-09-05 ENCOUNTER — Other Ambulatory Visit: Payer: Self-pay | Admitting: General Practice

## 2017-09-05 MED ORDER — SERTRALINE HCL 100 MG PO TABS: 100.0000 mg | ORAL_TABLET | Freq: Every day | ORAL | 0 refills | Status: DC

## 2017-10-01 DIAGNOSIS — L905 Scar conditions and fibrosis of skin: Secondary | ICD-10-CM | POA: Diagnosis not present

## 2017-10-01 DIAGNOSIS — Z85828 Personal history of other malignant neoplasm of skin: Secondary | ICD-10-CM | POA: Diagnosis not present

## 2017-10-01 DIAGNOSIS — L820 Inflamed seborrheic keratosis: Secondary | ICD-10-CM | POA: Diagnosis not present

## 2017-10-01 DIAGNOSIS — D485 Neoplasm of uncertain behavior of skin: Secondary | ICD-10-CM | POA: Diagnosis not present

## 2017-10-01 DIAGNOSIS — C44729 Squamous cell carcinoma of skin of left lower limb, including hip: Secondary | ICD-10-CM | POA: Diagnosis not present

## 2017-11-25 ENCOUNTER — Encounter: Payer: Self-pay | Admitting: Family Medicine

## 2017-11-25 ENCOUNTER — Ambulatory Visit: Payer: BLUE CROSS/BLUE SHIELD | Admitting: Family Medicine

## 2017-11-25 ENCOUNTER — Other Ambulatory Visit: Payer: Self-pay

## 2017-11-25 VITALS — BP 116/74 | HR 83 | Temp 98.7°F | Resp 16 | Ht 62.0 in | Wt 142.1 lb

## 2017-11-25 DIAGNOSIS — E785 Hyperlipidemia, unspecified: Secondary | ICD-10-CM

## 2017-11-25 DIAGNOSIS — G5601 Carpal tunnel syndrome, right upper limb: Secondary | ICD-10-CM | POA: Insufficient documentation

## 2017-11-25 LAB — BASIC METABOLIC PANEL
BUN: 20 mg/dL (ref 6–23)
CALCIUM: 9.8 mg/dL (ref 8.4–10.5)
CO2: 28 mEq/L (ref 19–32)
Chloride: 103 mEq/L (ref 96–112)
Creatinine, Ser: 0.73 mg/dL (ref 0.40–1.20)
GFR: 88.05 mL/min (ref >60.00)
Glucose, Bld: 90 mg/dL (ref 70–99)
POTASSIUM: 4.2 meq/L (ref 3.5–5.1)
Sodium: 139 mEq/L (ref 135–145)

## 2017-11-25 LAB — LIPID PANEL
CHOLESTEROL: 185 mg/dL (ref 0–200)
HDL: 67.3 mg/dL (ref >39.00)
LDL Cholesterol: 88 mg/dL (ref 0–99)
NonHDL: 117.56
Total CHOL/HDL Ratio: 3
Triglycerides: 149 mg/dL (ref 0.0–149.0)
VLDL: 29.8 mg/dL (ref 0.0–40.0)

## 2017-11-25 LAB — HEPATIC FUNCTION PANEL
ALK PHOS: 68 U/L (ref 39–117)
ALT: 34 U/L (ref 0–35)
AST: 28 U/L (ref 0–37)
Albumin: 4.6 g/dL (ref 3.5–5.2)
BILIRUBIN TOTAL: 1.4 mg/dL — AB (ref 0.2–1.2)
Bilirubin, Direct: 0.2 mg/dL (ref 0.0–0.3)
Total Protein: 7 g/dL (ref 6.0–8.3)

## 2017-11-25 MED ORDER — MELOXICAM 7.5 MG PO TABS: 7.5000 mg | ORAL_TABLET | Freq: Every day | ORAL | 0 refills | Status: DC

## 2017-11-25 NOTE — Progress Notes (Signed)
   Subjective:    Patient ID: Bethany Sexton, female    DOB: 07/22/1961, 57 y.o.   MRN: [id number]  HPI Hyperlipidemia- pt is taking Crestor daily and reports diffuse and severe body aches that seem to improve when she stops the cholesterol medication, 'even for 2 days'.    R hand numbness- pt reports hand will go to sleep, particularly at night.  Difficulty w/ grip strength and making a fist.  Pt reports constant numbness in 2nd, 3rd, 4th fingers- never the pinky- and sometimes the thumb.  Pt is R handed.   Review of Systems For ROS see HPI     Objective:   Physical Exam  Constitutional: She is oriented to person, place, and time. She appears well-developed and well-nourished. No distress.  HENT:  Head: Normocephalic and atraumatic.  Cardiovascular: Normal rate, regular rhythm and intact distal pulses.  Pulmonary/Chest: Effort normal and breath sounds normal. No respiratory distress.  Musculoskeletal: She exhibits no edema, tenderness or deformity.  Neurological: She is alert and oriented to person, place, and time. She has normal reflexes. No cranial nerve deficit.  + Phalen's on R  Psychiatric: She has a normal mood and affect. Her behavior is normal. Thought content normal.  Vitals reviewed.         Assessment & Plan:

## 2017-11-25 NOTE — Assessment & Plan Note (Signed)
Chronic problem.  + myalgias and arthralgias w/ Crestor.  Stop medication.  Check labs.  Start Zetia prn.  Pt expressed understanding and is in agreement w/ plan.

## 2017-11-25 NOTE — Patient Instructions (Signed)
Schedule your complete physical in 6 months We'll notify you of your lab results and make any changes if needed STOP the Crestor, based on the labs we will start Zetia as needed Start the Meloxicam night w/ food for the carpal tunnel inflammation Wear a wrist splint at night Call with any questions or concerns Happy New Year!!!

## 2017-11-25 NOTE — Assessment & Plan Note (Signed)
New.  Pt w/ + Phalen's on PE.  Hx is consistent w/ carpal tunnel.  Start nightly NSAID and wrist splint.  If no improvement will need referral to hand specialist.  Pt expressed understanding and is in agreement w/ plan.

## 2017-11-26 ENCOUNTER — Encounter: Payer: Self-pay | Admitting: General Practice

## 2017-11-26 ENCOUNTER — Encounter: Payer: Self-pay | Admitting: Family Medicine

## 2017-11-27 ENCOUNTER — Other Ambulatory Visit: Payer: Self-pay | Admitting: General Practice

## 2017-12-01 ENCOUNTER — Other Ambulatory Visit: Payer: Self-pay | Admitting: Family Medicine

## 2017-12-31 ENCOUNTER — Other Ambulatory Visit: Payer: Self-pay | Admitting: Family Medicine

## 2018-01-01 ENCOUNTER — Other Ambulatory Visit: Payer: Self-pay | Admitting: Family Medicine

## 2018-01-27 ENCOUNTER — Other Ambulatory Visit: Payer: Self-pay | Admitting: Family Medicine

## 2018-03-01 ENCOUNTER — Other Ambulatory Visit: Payer: Self-pay | Admitting: Family Medicine

## 2018-03-10 DIAGNOSIS — L905 Scar conditions and fibrosis of skin: Secondary | ICD-10-CM | POA: Diagnosis not present

## 2018-03-10 DIAGNOSIS — D0472 Carcinoma in situ of skin of left lower limb, including hip: Secondary | ICD-10-CM | POA: Diagnosis not present

## 2018-05-27 ENCOUNTER — Other Ambulatory Visit: Payer: Self-pay

## 2018-05-27 ENCOUNTER — Encounter: Payer: Self-pay | Admitting: Family Medicine

## 2018-05-27 ENCOUNTER — Ambulatory Visit (INDEPENDENT_AMBULATORY_CARE_PROVIDER_SITE_OTHER): Payer: 59 | Admitting: Family Medicine

## 2018-05-27 VITALS — BP 121/80 | HR 84 | Temp 98.1°F | Resp 16 | Ht 62.0 in | Wt 146.1 lb

## 2018-05-27 DIAGNOSIS — E559 Vitamin D deficiency, unspecified: Secondary | ICD-10-CM

## 2018-05-27 DIAGNOSIS — M2550 Pain in unspecified joint: Secondary | ICD-10-CM

## 2018-05-27 DIAGNOSIS — E785 Hyperlipidemia, unspecified: Secondary | ICD-10-CM | POA: Diagnosis not present

## 2018-05-27 DIAGNOSIS — Z0000 Encounter for general adult medical examination without abnormal findings: Secondary | ICD-10-CM

## 2018-05-27 DIAGNOSIS — Z1211 Encounter for screening for malignant neoplasm of colon: Secondary | ICD-10-CM | POA: Diagnosis not present

## 2018-05-27 DIAGNOSIS — F329 Major depressive disorder, single episode, unspecified: Secondary | ICD-10-CM

## 2018-05-27 DIAGNOSIS — F32A Depression, unspecified: Secondary | ICD-10-CM

## 2018-05-27 LAB — CBC WITH DIFFERENTIAL/PLATELET
BASOS PCT: 0.4 % (ref 0.0–3.0)
Basophils Absolute: 0 10*3/uL (ref 0.0–0.1)
EOS PCT: 2.8 % (ref 0.0–5.0)
Eosinophils Absolute: 0.2 10*3/uL (ref 0.0–0.7)
HEMATOCRIT: 42.8 % (ref 36.0–46.0)
HEMOGLOBIN: 14.9 g/dL (ref 12.0–15.0)
Lymphocytes Relative: 27.4 % (ref 12.0–46.0)
Lymphs Abs: 1.9 10*3/uL (ref 0.7–4.0)
MCHC: 34.8 g/dL (ref 30.0–36.0)
MCV: 90.2 fl (ref 78.0–100.0)
MONO ABS: 0.5 10*3/uL (ref 0.1–1.0)
Monocytes Relative: 6.9 % (ref 3.0–12.0)
Neutro Abs: 4.3 10*3/uL (ref 1.4–7.7)
Neutrophils Relative %: 62.5 % (ref 43.0–77.0)
PLATELETS: 243 10*3/uL (ref 150.0–400.0)
RBC: 4.74 Mil/uL (ref 3.87–5.11)
RDW: 13.7 % (ref 11.5–15.5)
WBC: 6.9 10*3/uL (ref 4.0–10.5)

## 2018-05-27 LAB — TSH: TSH: 1.73 u[IU]/mL (ref 0.35–4.50)

## 2018-05-27 LAB — HEPATIC FUNCTION PANEL
ALBUMIN: 4.4 g/dL (ref 3.5–5.2)
ALT: 17 U/L (ref 0–35)
AST: 21 U/L (ref 0–37)
Alkaline Phosphatase: 63 U/L (ref 39–117)
BILIRUBIN TOTAL: 0.8 mg/dL (ref 0.2–1.2)
Bilirubin, Direct: 0.1 mg/dL (ref 0.0–0.3)
Total Protein: 7.2 g/dL (ref 6.0–8.3)

## 2018-05-27 LAB — LIPID PANEL
Cholesterol: 296 mg/dL — ABNORMAL HIGH (ref 0–200)
HDL: 57.5 mg/dL (ref >39.00)
NONHDL: 238.43
Total CHOL/HDL Ratio: 5
Triglycerides: 207 mg/dL — ABNORMAL HIGH (ref 0.0–149.0)
VLDL: 41.4 mg/dL — ABNORMAL HIGH (ref 0.0–40.0)

## 2018-05-27 LAB — BASIC METABOLIC PANEL
BUN: 23 mg/dL (ref 6–23)
CHLORIDE: 103 meq/L (ref 96–112)
CO2: 30 mEq/L (ref 19–32)
CREATININE: 0.76 mg/dL (ref 0.40–1.20)
Calcium: 9.9 mg/dL (ref 8.4–10.5)
GFR: 83.9 mL/min (ref >60.00)
Glucose, Bld: 85 mg/dL (ref 70–99)
POTASSIUM: 4.3 meq/L (ref 3.5–5.1)
Sodium: 140 mEq/L (ref 135–145)

## 2018-05-27 LAB — SEDIMENTATION RATE: Sed Rate: 8 mm/hr (ref 0–30)

## 2018-05-27 LAB — VITAMIN D 25 HYDROXY (VIT D DEFICIENCY, FRACTURES): VITD: 36.74 ng/mL (ref 30.00–100.00)

## 2018-05-27 LAB — LDL CHOLESTEROL, DIRECT: Direct LDL: 203 mg/dL

## 2018-05-27 MED ORDER — SERTRALINE HCL 100 MG PO TABS: 100.0000 mg | ORAL_TABLET | Freq: Every day | ORAL | 1 refills | Status: DC

## 2018-05-27 NOTE — Assessment & Plan Note (Signed)
Pt's PE WNL.  Due for mammo- pt to call GYN.  UTD on pap.  Due for colon cancer screen- pt declines colonoscopy, willing to do cologuard.  Check labs.  Anticipatory guidance provided.

## 2018-05-27 NOTE — Progress Notes (Signed)
   Subjective:    Patient ID: Bethany Sexton, female    DOB: 01/30/1961, 57 y.o.   MRN: [id number]  HPI CPE- UTD on pap w/ Dr Renaldo FiddlerAdkins, due for repeat mammo.  No hx of colon cancer screen.  Pt refusing colonoscopy.  Willing to do cologuard- did not complete 2 yrs ago.    Review of Systems Patient reports no vision/ hearing changes, adenopathy,fever, weight change,  persistant/recurrent hoarseness , swallowing issues, chest pain, palpitations, edema, persistant/recurrent cough, hemoptysis, dyspnea (rest/exertional/paroxysmal nocturnal), gastrointestinal bleeding (melena, rectal bleeding), abdominal pain, significant heartburn, bowel changes, GU symptoms (dysuria, hematuria, incontinence), Gyn symptoms (abnormal  bleeding, pain),  syncope, focal weakness, memory loss, numbness & tingling, skin/hair/nail changes, abnormal bruising or bleeding, anxiety.   + depression despite Sertraline.  Pt feels that there is 1 week of every month that she is just 'really really down'.  Pt is not interested in going up on medication.  + polyarthralgia- ongoing issue for pt despite stopping Crestor.  Some relief w/ Mobic.  Has pain when waking in the AM.    Objective:   Physical Exam General Appearance:    Alert, cooperative, no distress, appears stated age  Head:    Normocephalic, without obvious abnormality, atraumatic  Eyes:    PERRL, conjunctiva/corneas clear, EOM's intact, fundi    benign, both eyes  Ears:    Normal TM's and external ear canals, both ears  Nose:   Nares normal, septum midline, mucosa normal, no drainage    or sinus tenderness  Throat:   Lips, mucosa, and tongue normal; teeth and gums normal  Neck:   Supple, symmetrical, trachea midline, no adenopathy;    Thyroid: no enlargement/tenderness/nodules  Back:     Symmetric, no curvature, ROM normal, no CVA tenderness  Lungs:     Clear to auscultation bilaterally, respirations unlabored  Chest Wall:    No tenderness or deformity   Heart:     Regular rate and rhythm, S1 and S2 normal, no murmur, rub   or gallop  Breast Exam:    Deferred to GYN  Abdomen:     Soft, non-tender, bowel sounds active all four quadrants,    no masses, no organomegaly  Genitalia:    Deferred to GYN  Rectal:    Extremities:   Extremities normal, atraumatic, no cyanosis or edema  Pulses:   2+ and symmetric all extremities  Skin:   Skin color, texture, turgor normal, no rashes or lesions  Lymph nodes:   Cervical, supraclavicular, and axillary nodes normal  Neurologic:   CNII-XII intact, normal strength, sensation and reflexes    throughout          Assessment & Plan:  Polyarthralgia- ongoing.  Pt felt it was statin related but we stopped that at previous visit and sxs did not improve.  She has AM sxs which could be early indication of RA.  Check labs to r/o systemic or rheumatologic process.  Pt expressed understanding and is in agreement w/ plan.

## 2018-05-27 NOTE — Patient Instructions (Signed)
Follow up in 1 year or as needed We'll notify you of your lab results and make any changes if needed Continue to work on healthy diet and regular exercise- you can do it! Please call Dr Renaldo FiddlerAdkins and schedule your mammogram Complete the Cologuard and return as directed Add Turmeric, Vit D, and Fish Oil for joint health/pain Call with any questions or concerns Have a great summer!!

## 2018-05-27 NOTE — Assessment & Plan Note (Signed)
Chronic problem.  Stopped statin due to polyarthralgia but no improvement in sxs.  Repeat labs and restart meds prn.

## 2018-05-27 NOTE — Assessment & Plan Note (Signed)
Pt has hx of Vit D deficiency.  Check labs and replete prn. 

## 2018-05-27 NOTE — Assessment & Plan Note (Signed)
Ongoing issue for pt.  She continues to have 1 week of the month w/ worsening depression sxs.  Suspect this is hormonal.  Discussed increasing medication- pt is not interested at this time.  Will continue to follow.

## 2018-05-28 ENCOUNTER — Other Ambulatory Visit: Payer: Self-pay | Admitting: General Practice

## 2018-05-28 DIAGNOSIS — E785 Hyperlipidemia, unspecified: Secondary | ICD-10-CM

## 2018-05-28 MED ORDER — ROSUVASTATIN CALCIUM 20 MG PO TABS: 20.0000 mg | ORAL_TABLET | Freq: Every day | ORAL | 6 refills | Status: DC

## 2018-05-29 ENCOUNTER — Other Ambulatory Visit: Payer: Self-pay | Admitting: General Practice

## 2018-05-29 LAB — RHEUMATOID FACTOR: Rhuematoid fact SerPl-aCnc: <14 IU/mL (ref <14)

## 2018-05-29 LAB — ANA: Anti Nuclear Antibody(ANA): NEGATIVE

## 2018-05-29 MED ORDER — EZETIMIBE 10 MG PO TABS: 10.0000 mg | ORAL_TABLET | Freq: Every day | ORAL | 3 refills | Status: DC

## 2018-05-29 MED ORDER — MELOXICAM 7.5 MG PO TABS: 7.5000 mg | ORAL_TABLET | Freq: Every day | ORAL | 0 refills | Status: DC

## 2018-06-16 ENCOUNTER — Encounter: Payer: Self-pay | Admitting: Family Medicine

## 2018-11-20 ENCOUNTER — Other Ambulatory Visit: Payer: Self-pay | Admitting: Family Medicine

## 2019-05-21 ENCOUNTER — Other Ambulatory Visit: Payer: Self-pay | Admitting: Family Medicine

## 2019-05-29 ENCOUNTER — Encounter: Payer: 59 | Admitting: Family Medicine

## 2019-06-30 ENCOUNTER — Ambulatory Visit: Payer: 59 | Admitting: Family Medicine

## 2019-11-04 ENCOUNTER — Telehealth: Payer: 59

## 2019-11-04 ENCOUNTER — Other Ambulatory Visit: Payer: Self-pay

## 2019-11-04 ENCOUNTER — Telehealth: Payer: Self-pay

## 2019-11-05 ENCOUNTER — Encounter: Payer: Self-pay | Admitting: Family Medicine

## 2019-11-05 ENCOUNTER — Other Ambulatory Visit: Payer: Self-pay | Admitting: Family Medicine

## 2019-11-05 ENCOUNTER — Ambulatory Visit (INDEPENDENT_AMBULATORY_CARE_PROVIDER_SITE_OTHER): Payer: 59 | Admitting: Family Medicine

## 2019-11-05 ENCOUNTER — Other Ambulatory Visit: Payer: Self-pay

## 2019-11-05 VITALS — Ht 62.0 in | Wt 139.0 lb

## 2019-11-05 DIAGNOSIS — E785 Hyperlipidemia, unspecified: Secondary | ICD-10-CM

## 2019-11-05 DIAGNOSIS — E559 Vitamin D deficiency, unspecified: Secondary | ICD-10-CM

## 2019-11-05 DIAGNOSIS — Z0000 Encounter for general adult medical examination without abnormal findings: Secondary | ICD-10-CM | POA: Diagnosis not present

## 2019-11-05 NOTE — Progress Notes (Signed)
   Virtual Visit via Video   I connected with patient on 11/05/19 at 10:30 AM EST by a video enabled telemedicine application and verified that I am speaking with the correct person using two identifiers.  Location patient: Home Location provider: [facility]up[facility], Office Persons participating in the virtual visit: Patient, Provider, Jeannette (Jess B)  I discussed the limitations of evaluation and management by telemedicine and the availability of in person appointments. The patient expressed understanding and agreed to proceed.  Subjective:   HPI:   CPE- overdue for colonoscopy (pt declines at this time), mammo, and pap.  Sees Dr Julien Girt.  UTD on Tdap and flu.  No concerns today.  ROS:  Patient reports no vision/ hearing changes, adenopathy,fever, weight change,  persistant/recurrent hoarseness , swallowing issues, chest pain, palpitations, edema, persistant/recurrent cough, hemoptysis, dyspnea (rest/exertional/paroxysmal nocturnal), gastrointestinal bleeding (melena, rectal bleeding), abdominal pain, significant heartburn, bowel changes, GU symptoms (dysuria, hematuria, incontinence), Gyn symptoms (abnormal  bleeding, pain),  syncope, focal weakness, memory loss, numbness & tingling, skin/hair/nail changes, abnormal bruising or bleeding, anxiety, or depression.   Patient Active Problem List   Diagnosis Date Noted  . Physical exam 05/27/2018  . Vitamin D deficiency 05/27/2018  . Right carpal tunnel syndrome 11/25/2017  . Hyperlipidemia 03/14/2017  . Anxiety 05/29/2014  . Depression 08/18/2007  . ASTHMA 08/18/2007    Social History   Tobacco Use  . Smoking status: Never Smoker  . Smokeless tobacco: Never Used  Substance Use Topics  . Alcohol use: Yes    Alcohol/week: 0.0 standard drinks    Current Outpatient Medications:  .  sertraline (ZOLOFT) 100 MG tablet, TAKE 1 TABLET BY MOUTH EVERY DAY, Disp: 90 tablet, Rfl: 1  Allergies  Allergen Reactions  . Sulfonamide Derivatives  Rash    Objective:   Ht 5\' 2"  (1.575 m)   Wt 139 lb (63 kg)   BMI 25.42 kg/m   AAOx3, NAD NCAT, EOMI No obvious CN deficits Coloring WNL Pt is able to speak clearly, coherently without shortness of breath or increased work of breathing.  Thought process is linear.  Mood is appropriate.   Assessment and Plan:   CPE- pt's limited PE WNL.  Due for pap and mammo- pt to call GYN.  Discussed colonoscopy- pt declines at this time.  UTD on Tdap and flu.  Check labs.  Anticipatory guidance provided.    Annye Asa, MD 11/05/2019

## 2019-11-05 NOTE — Progress Notes (Signed)
I have discussed the procedure for the virtual visit with the patient who has given consent to proceed with assessment and treatment.   Pt unable to obtain vitals.   Bethany Sexton, CMA     

## 2019-11-06 ENCOUNTER — Ambulatory Visit (INDEPENDENT_AMBULATORY_CARE_PROVIDER_SITE_OTHER): Payer: 59

## 2019-11-06 ENCOUNTER — Other Ambulatory Visit: Payer: Self-pay

## 2019-11-06 DIAGNOSIS — E785 Hyperlipidemia, unspecified: Secondary | ICD-10-CM

## 2019-11-06 DIAGNOSIS — E559 Vitamin D deficiency, unspecified: Secondary | ICD-10-CM

## 2019-11-06 LAB — CBC WITH DIFFERENTIAL/PLATELET
Basophils Absolute: 0 10*3/uL (ref 0.0–0.1)
Basophils Relative: 0.5 % (ref 0.0–3.0)
Eosinophils Absolute: 0.1 10*3/uL (ref 0.0–0.7)
Eosinophils Relative: 2 % (ref 0.0–5.0)
HCT: 43.6 % (ref 36.0–46.0)
Hemoglobin: 14.5 g/dL (ref 12.0–15.0)
Lymphocytes Relative: 29.9 % (ref 12.0–46.0)
Lymphs Abs: 1.9 10*3/uL (ref 0.7–4.0)
MCHC: 33.4 g/dL (ref 30.0–36.0)
MCV: 91.7 fl (ref 78.0–100.0)
Monocytes Absolute: 0.4 10*3/uL (ref 0.1–1.0)
Monocytes Relative: 5.7 % (ref 3.0–12.0)
Neutro Abs: 4 10*3/uL (ref 1.4–7.7)
Neutrophils Relative %: 61.9 % (ref 43.0–77.0)
Platelets: 236 10*3/uL (ref 150.0–400.0)
RBC: 4.75 Mil/uL (ref 3.87–5.11)
RDW: 14 % (ref 11.5–15.5)
WBC: 6.5 10*3/uL (ref 4.0–10.5)

## 2019-11-06 LAB — HEPATIC FUNCTION PANEL
ALT: 16 U/L (ref 0–35)
AST: 21 U/L (ref 0–37)
Albumin: 4.6 g/dL (ref 3.5–5.2)
Alkaline Phosphatase: 68 U/L (ref 39–117)
Bilirubin, Direct: 0.1 mg/dL (ref 0.0–0.3)
Total Bilirubin: 0.8 mg/dL (ref 0.2–1.2)
Total Protein: 7.1 g/dL (ref 6.0–8.3)

## 2019-11-06 LAB — LIPID PANEL
Cholesterol: 370 mg/dL — ABNORMAL HIGH (ref 0–200)
HDL: 57.1 mg/dL (ref >39.00)
NonHDL: 312.55
Total CHOL/HDL Ratio: 6
Triglycerides: 222 mg/dL — ABNORMAL HIGH (ref 0.0–149.0)
VLDL: 44.4 mg/dL — ABNORMAL HIGH (ref 0.0–40.0)

## 2019-11-06 LAB — BASIC METABOLIC PANEL
BUN: 24 mg/dL — ABNORMAL HIGH (ref 6–23)
CO2: 29 mEq/L (ref 19–32)
Calcium: 9.7 mg/dL (ref 8.4–10.5)
Chloride: 105 mEq/L (ref 96–112)
Creatinine, Ser: 0.83 mg/dL (ref 0.40–1.20)
GFR: 70.93 mL/min (ref >60.00)
Glucose, Bld: 86 mg/dL (ref 70–99)
Potassium: 4 mEq/L (ref 3.5–5.1)
Sodium: 140 mEq/L (ref 135–145)

## 2019-11-06 LAB — LDL CHOLESTEROL, DIRECT: Direct LDL: 255 mg/dL

## 2019-11-06 LAB — TSH: TSH: 1.27 u[IU]/mL (ref 0.35–4.50)

## 2019-11-06 LAB — VITAMIN D 25 HYDROXY (VIT D DEFICIENCY, FRACTURES): VITD: 52.01 ng/mL (ref 30.00–100.00)

## 2019-11-09 ENCOUNTER — Other Ambulatory Visit: Payer: Self-pay | Admitting: General Practice

## 2019-11-09 DIAGNOSIS — E785 Hyperlipidemia, unspecified: Secondary | ICD-10-CM

## 2019-11-09 MED ORDER — ROSUVASTATIN CALCIUM 20 MG PO TABS: 20.0000 mg | ORAL_TABLET | Freq: Every day | ORAL | 6 refills | Status: DC

## 2019-11-26 ENCOUNTER — Ambulatory Visit: Payer: 59 | Attending: Internal Medicine

## 2019-11-26 DIAGNOSIS — Z20822 Contact with and (suspected) exposure to covid-19: Secondary | ICD-10-CM

## 2019-11-28 LAB — NOVEL CORONAVIRUS, NAA: SARS-CoV-2, NAA: NOT DETECTED

## 2019-12-04 ENCOUNTER — Other Ambulatory Visit: Payer: Self-pay | Admitting: Family Medicine

## 2019-12-30 ENCOUNTER — Ambulatory Visit (INDEPENDENT_AMBULATORY_CARE_PROVIDER_SITE_OTHER): Payer: 59

## 2019-12-30 ENCOUNTER — Other Ambulatory Visit: Payer: Self-pay

## 2019-12-30 DIAGNOSIS — E785 Hyperlipidemia, unspecified: Secondary | ICD-10-CM | POA: Diagnosis not present

## 2019-12-30 LAB — HEPATIC FUNCTION PANEL
ALT: 20 U/L (ref 0–35)
AST: 22 U/L (ref 0–37)
Albumin: 4.5 g/dL (ref 3.5–5.2)
Alkaline Phosphatase: 59 U/L (ref 39–117)
Bilirubin, Direct: 0.2 mg/dL (ref 0.0–0.3)
Total Bilirubin: 0.9 mg/dL (ref 0.2–1.2)
Total Protein: 7 g/dL (ref 6.0–8.3)

## 2020-05-03 ENCOUNTER — Other Ambulatory Visit: Payer: Self-pay | Admitting: Family Medicine

## 2020-08-15 ENCOUNTER — Encounter: Payer: Self-pay | Admitting: Family Medicine

## 2020-08-15 ENCOUNTER — Telehealth: Payer: Self-pay | Admitting: Family Medicine

## 2020-08-15 NOTE — Telephone Encounter (Signed)
At this time we recommend starting OTC Vit D, Vit C, and Zinc supplements to boost the immune system.  Fluids are VERY important, as is rest.  Tylenol or ibuprofen for pain or fever.  And please keep [place] updated if your symptoms were to change or worsen

## 2020-08-15 NOTE — Telephone Encounter (Signed)
Called pt and left a detailed message to advise.  

## 2020-08-15 NOTE — Telephone Encounter (Signed)
Please advise 

## 2020-08-15 NOTE — Telephone Encounter (Signed)
Patient states that she tested positive for Covid - she would like to know what her next steps should be.  States that she has a runny nose and a sore throat - Please advise

## 2020-08-16 MED ORDER — AZITHROMYCIN 250 MG PO TABS: ORAL_TABLET | ORAL | 0 refills | Status: AC

## 2020-10-25 ENCOUNTER — Other Ambulatory Visit: Payer: Self-pay | Admitting: Family Medicine

## 2020-11-07 ENCOUNTER — Ambulatory Visit: Payer: 59 | Admitting: Family Medicine

## 2020-12-07 ENCOUNTER — Other Ambulatory Visit: Payer: Self-pay | Admitting: Family Medicine

## 2020-12-08 ENCOUNTER — Other Ambulatory Visit: Payer: Self-pay | Admitting: Internal Medicine

## 2020-12-08 ENCOUNTER — Other Ambulatory Visit: Payer: Self-pay | Admitting: Psychiatry

## 2020-12-08 DIAGNOSIS — Z1231 Encounter for screening mammogram for malignant neoplasm of breast: Secondary | ICD-10-CM

## 2020-12-21 ENCOUNTER — Ambulatory Visit (INDEPENDENT_AMBULATORY_CARE_PROVIDER_SITE_OTHER): Payer: 59

## 2020-12-21 ENCOUNTER — Other Ambulatory Visit: Payer: Self-pay | Admitting: *Deleted

## 2020-12-21 DIAGNOSIS — R002 Palpitations: Secondary | ICD-10-CM | POA: Diagnosis not present

## 2021-01-10 ENCOUNTER — Ambulatory Visit: Payer: 59 | Admitting: Cardiology

## 2021-01-20 ENCOUNTER — Ambulatory Visit
Admission: RE | Admit: 2021-01-20 | Discharge: 2021-01-20 | Disposition: A | Discharge status: Home / Self Care | Payer: 59 | Source: Ambulatory Visit | Attending: Internal Medicine | Admitting: Internal Medicine

## 2021-01-20 ENCOUNTER — Other Ambulatory Visit: Payer: Self-pay

## 2021-01-20 DIAGNOSIS — Z1231 Encounter for screening mammogram for malignant neoplasm of breast: Secondary | ICD-10-CM

## 2021-05-17 ENCOUNTER — Encounter: Payer: Self-pay | Admitting: *Deleted

## 2021-10-14 IMAGING — MG MM DIGITAL SCREENING BILAT W/ TOMO AND CAD
8 series · 8 of 24 positions shown · non-contrast
Comparison: Previous exam(s).

CLINICAL DATA: Screening.

EXAM:
DIGITAL SCREENING BILATERAL MAMMOGRAM WITH TOMOSYNTHESIS AND CAD
TECHNIQUE: Bilateral screening digital craniocaudal and mediolateral oblique
mammograms were obtained. Bilateral screening digital breast
tomosynthesis was performed. The images were evaluated with
computer-aided detection.

[L CC synth-2D]
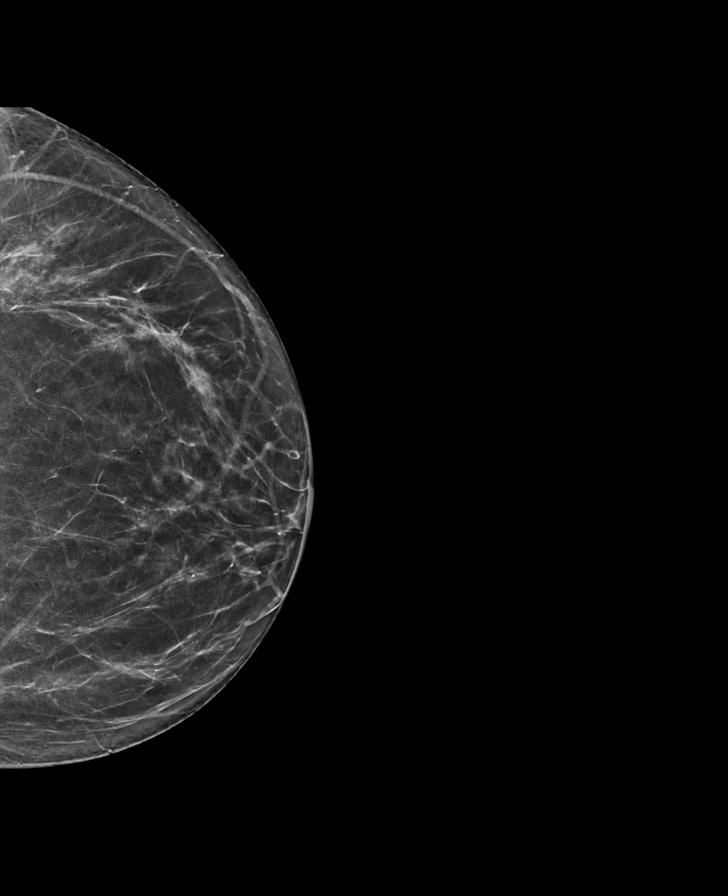

[R CC synth-2D]
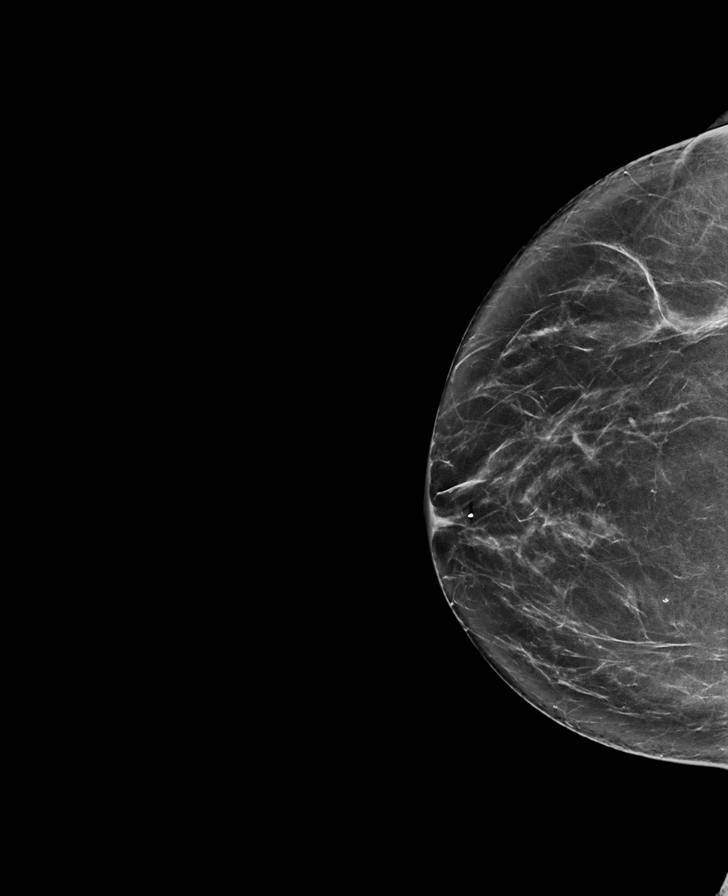

[R MLO synth-2D]
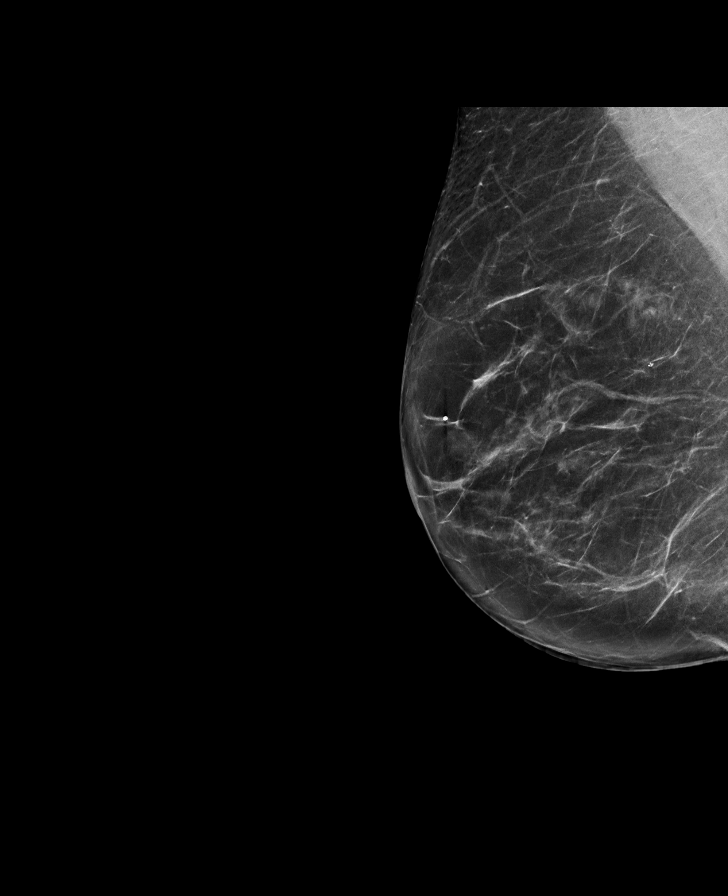

[L MLO synth-2D]
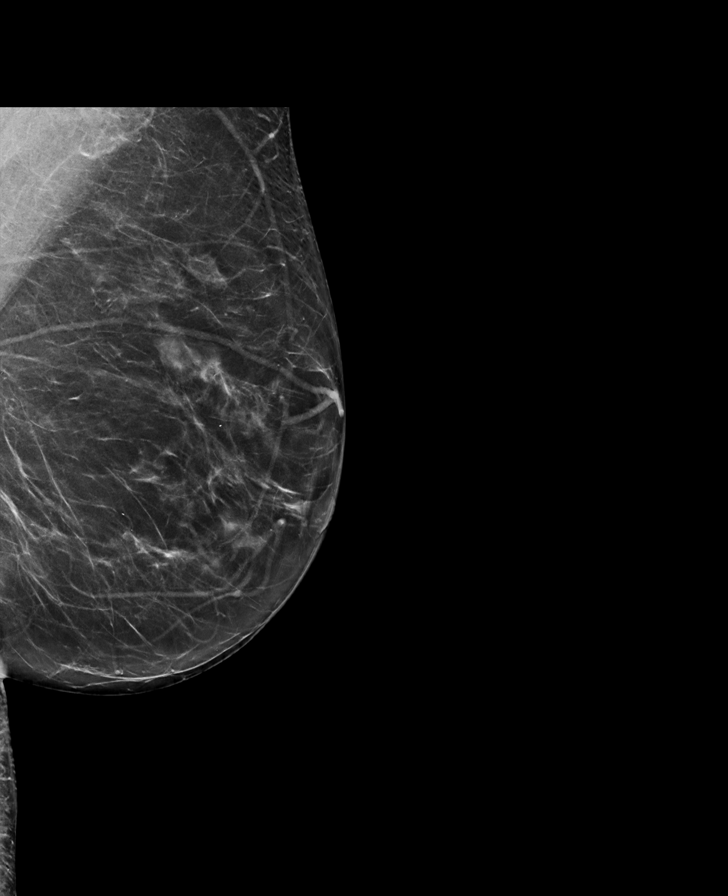

[L CC tomo · tomo slice 34/67.0]
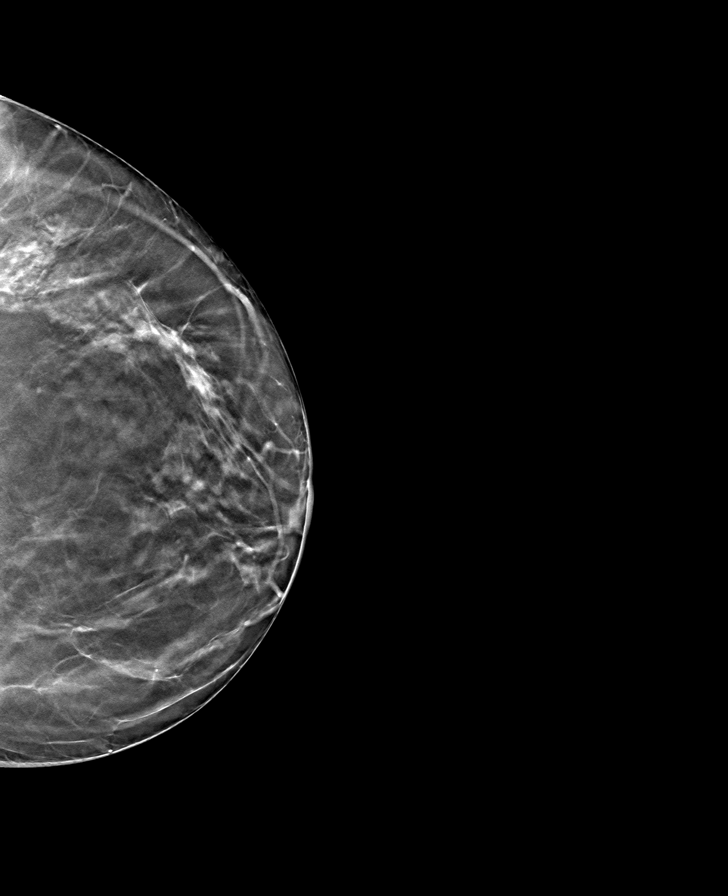

[R CC tomo · tomo slice 39/77.0]
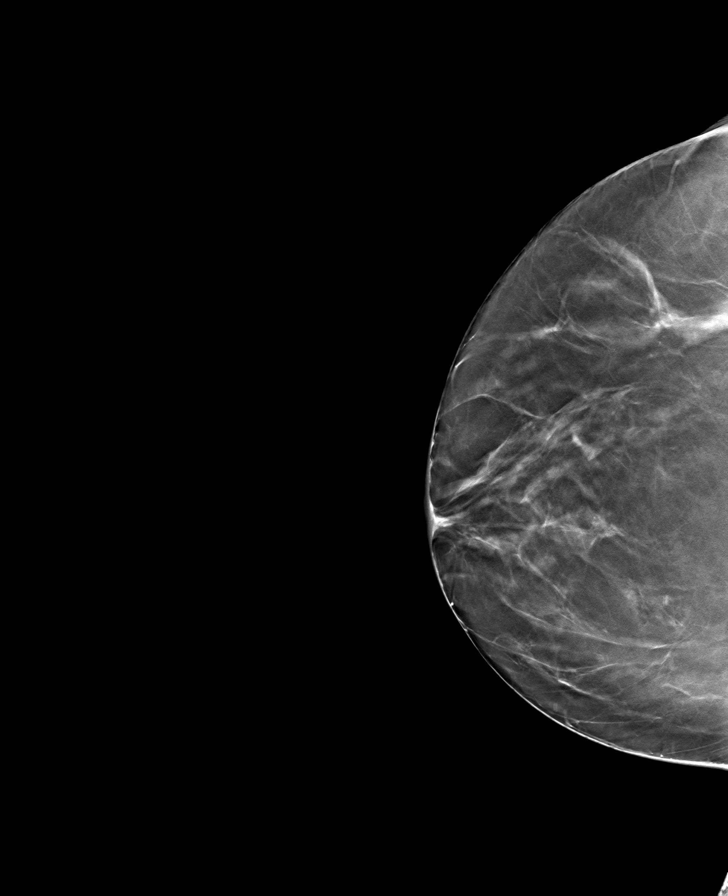

[R MLO tomo · tomo slice 42/83.0]
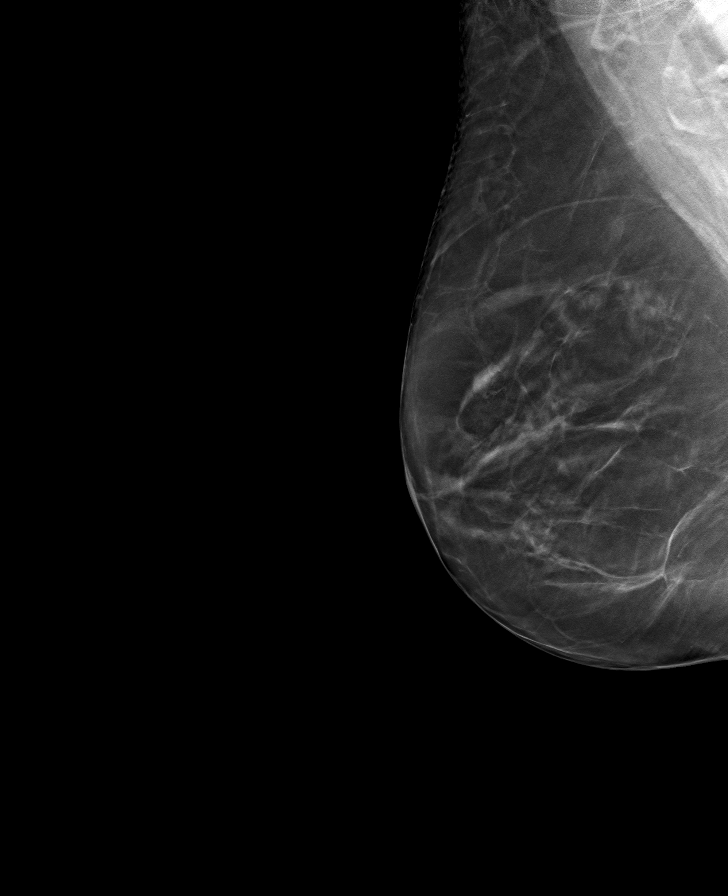

[L MLO tomo · tomo slice 39/78.0]
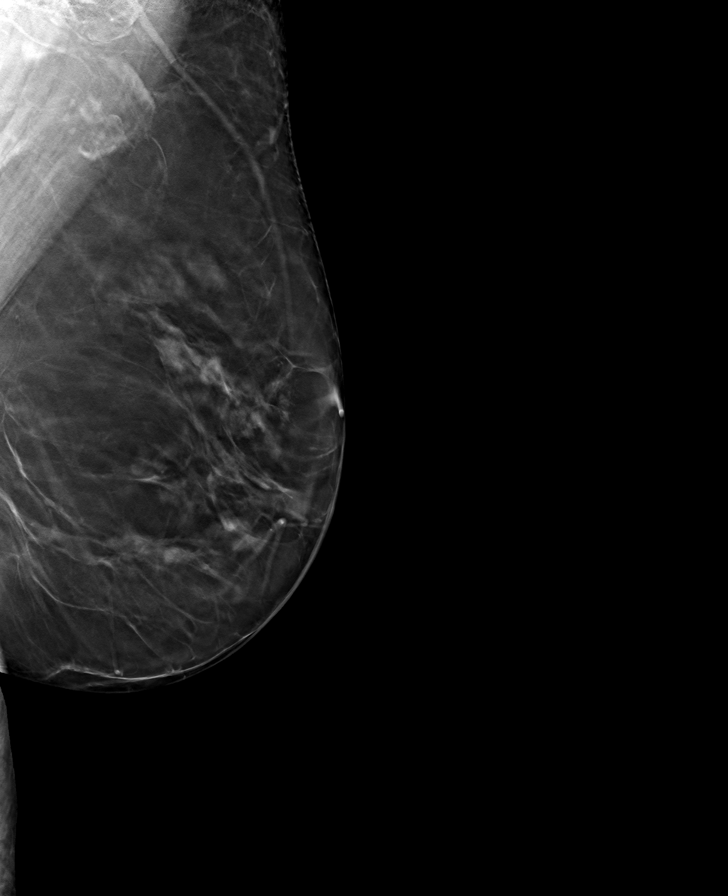

[8 of 24 positions shown; findings below may reference images not displayed]

ACR Breast Density Category b: There are scattered areas of
fibroglandular density.
FINDINGS: There are no findings suspicious for malignancy. The images were
evaluated with computer-aided detection.
IMPRESSION: No mammographic evidence of malignancy. A result letter of this
screening mammogram will be mailed directly to the patient.

RECOMMENDATION:
Screening mammogram in one year. (Code:[id number])

BI-RADS CATEGORY  1: Negative.

## 2022-03-21 DIAGNOSIS — L718 Other rosacea: Secondary | ICD-10-CM | POA: Diagnosis not present

## 2022-07-26 DIAGNOSIS — I10 Essential (primary) hypertension: Secondary | ICD-10-CM | POA: Diagnosis not present

## 2022-07-26 DIAGNOSIS — E559 Vitamin D deficiency, unspecified: Secondary | ICD-10-CM | POA: Diagnosis not present

## 2022-08-03 DIAGNOSIS — Z0000 Encounter for general adult medical examination without abnormal findings: Secondary | ICD-10-CM | POA: Diagnosis not present

## 2022-08-03 DIAGNOSIS — Z1331 Encounter for screening for depression: Secondary | ICD-10-CM | POA: Diagnosis not present

## 2022-08-03 DIAGNOSIS — I471 Supraventricular tachycardia: Secondary | ICD-10-CM | POA: Diagnosis not present

## 2022-08-03 DIAGNOSIS — I10 Essential (primary) hypertension: Secondary | ICD-10-CM | POA: Diagnosis not present

## 2022-08-03 DIAGNOSIS — Z1339 Encounter for screening examination for other mental health and behavioral disorders: Secondary | ICD-10-CM | POA: Diagnosis not present

## 2022-08-17 ENCOUNTER — Other Ambulatory Visit: Payer: Self-pay | Admitting: Internal Medicine

## 2022-08-17 DIAGNOSIS — E785 Hyperlipidemia, unspecified: Secondary | ICD-10-CM

## 2022-08-28 LAB — COLOGUARD: COLOGUARD: NEGATIVE

## 2022-09-25 ENCOUNTER — Ambulatory Visit
Admission: RE | Admit: 2022-09-25 | Discharge: 2022-09-25 | Disposition: A | Discharge status: Home / Self Care | Payer: No Typology Code available for payment source | Source: Ambulatory Visit | Attending: Internal Medicine | Admitting: Internal Medicine

## 2022-09-25 DIAGNOSIS — E785 Hyperlipidemia, unspecified: Secondary | ICD-10-CM

## 2022-10-09 DIAGNOSIS — H33311 Horseshoe tear of retina without detachment, right eye: Secondary | ICD-10-CM | POA: Diagnosis not present

## 2023-04-22 DIAGNOSIS — Z124 Encounter for screening for malignant neoplasm of cervix: Secondary | ICD-10-CM | POA: Diagnosis not present

## 2023-04-22 DIAGNOSIS — Z01419 Encounter for gynecological examination (general) (routine) without abnormal findings: Secondary | ICD-10-CM | POA: Diagnosis not present

## 2023-04-22 DIAGNOSIS — Z6825 Body mass index (BMI) 25.0-25.9, adult: Secondary | ICD-10-CM | POA: Diagnosis not present

## 2023-08-02 DIAGNOSIS — R21 Rash and other nonspecific skin eruption: Secondary | ICD-10-CM | POA: Diagnosis not present

## 2023-08-02 DIAGNOSIS — L987 Excessive and redundant skin and subcutaneous tissue: Secondary | ICD-10-CM | POA: Diagnosis not present

## 2023-08-08 DIAGNOSIS — E785 Hyperlipidemia, unspecified: Secondary | ICD-10-CM | POA: Diagnosis not present

## 2023-08-08 DIAGNOSIS — E559 Vitamin D deficiency, unspecified: Secondary | ICD-10-CM | POA: Diagnosis not present

## 2023-10-11 DIAGNOSIS — Z411 Encounter for cosmetic surgery: Secondary | ICD-10-CM | POA: Diagnosis not present

## 2023-10-23 DIAGNOSIS — H40013 Open angle with borderline findings, low risk, bilateral: Secondary | ICD-10-CM | POA: Diagnosis not present

## 2023-11-11 DIAGNOSIS — H40013 Open angle with borderline findings, low risk, bilateral: Secondary | ICD-10-CM | POA: Diagnosis not present

## 2024-01-06 DIAGNOSIS — B079 Viral wart, unspecified: Secondary | ICD-10-CM | POA: Diagnosis not present

## 2024-01-06 DIAGNOSIS — L814 Other melanin hyperpigmentation: Secondary | ICD-10-CM | POA: Diagnosis not present

## 2024-01-06 DIAGNOSIS — Z08 Encounter for follow-up examination after completed treatment for malignant neoplasm: Secondary | ICD-10-CM | POA: Diagnosis not present

## 2024-01-06 DIAGNOSIS — Z85828 Personal history of other malignant neoplasm of skin: Secondary | ICD-10-CM | POA: Diagnosis not present

## 2024-01-06 DIAGNOSIS — D492 Neoplasm of unspecified behavior of bone, soft tissue, and skin: Secondary | ICD-10-CM | POA: Diagnosis not present

## 2024-02-17 DIAGNOSIS — L905 Scar conditions and fibrosis of skin: Secondary | ICD-10-CM | POA: Diagnosis not present

## 2024-05-11 ENCOUNTER — Other Ambulatory Visit: Payer: Self-pay | Admitting: *Deleted

## 2024-05-11 DIAGNOSIS — R42 Dizziness and giddiness: Secondary | ICD-10-CM

## 2024-08-18 DIAGNOSIS — E785 Hyperlipidemia, unspecified: Secondary | ICD-10-CM | POA: Diagnosis not present

## 2024-08-18 DIAGNOSIS — Z1389 Encounter for screening for other disorder: Secondary | ICD-10-CM | POA: Diagnosis not present

## 2024-08-18 DIAGNOSIS — E559 Vitamin D deficiency, unspecified: Secondary | ICD-10-CM | POA: Diagnosis not present

## 2024-08-25 DIAGNOSIS — Z0000 Encounter for general adult medical examination without abnormal findings: Secondary | ICD-10-CM | POA: Diagnosis not present

## 2024-08-25 DIAGNOSIS — E785 Hyperlipidemia, unspecified: Secondary | ICD-10-CM | POA: Diagnosis not present
